# Patient Record
Sex: Male | Born: 1970 | Race: Black or African American | Hispanic: No | Marital: Single | Smoking: Never smoker
Health system: Southern US, Community
[De-identification: ages and names within clinical notes are randomized; demographics above are authoritative.]

## PROBLEM LIST (undated history)

## (undated) DIAGNOSIS — E119 Type 2 diabetes mellitus without complications: Secondary | ICD-10-CM

## (undated) DIAGNOSIS — E785 Hyperlipidemia, unspecified: Secondary | ICD-10-CM

## (undated) DIAGNOSIS — I10 Essential (primary) hypertension: Secondary | ICD-10-CM

## (undated) DIAGNOSIS — D573 Sickle-cell trait: Secondary | ICD-10-CM

## (undated) HISTORY — DX: Hyperlipidemia, unspecified: E78.5

## (undated) HISTORY — PX: KIDNEY STONE SURGERY: SHX686

## (undated) HISTORY — DX: Essential (primary) hypertension: I10

## (undated) HISTORY — DX: Type 2 diabetes mellitus without complications: E11.9

## (undated) HISTORY — PX: CYST EXCISION PERINEAL: SHX6278

## (undated) HISTORY — DX: Sickle-cell trait: D57.3

---

## 2020-01-27 ENCOUNTER — Emergency Department (HOSPITAL_COMMUNITY)
Admission: EM | Admit: 2020-01-27 | Discharge: 2020-01-27 | Disposition: A | Discharge status: Home / Self Care | Payer: HRSA Program | Attending: Emergency Medicine | Admitting: Emergency Medicine

## 2020-01-27 ENCOUNTER — Emergency Department (HOSPITAL_COMMUNITY): Payer: HRSA Program

## 2020-01-27 ENCOUNTER — Encounter (HOSPITAL_COMMUNITY): Payer: Self-pay | Admitting: Pediatrics

## 2020-01-27 ENCOUNTER — Other Ambulatory Visit: Payer: Self-pay

## 2020-01-27 DIAGNOSIS — U071 COVID-19: Secondary | ICD-10-CM | POA: Insufficient documentation

## 2020-01-27 DIAGNOSIS — R7989 Other specified abnormal findings of blood chemistry: Secondary | ICD-10-CM | POA: Diagnosis not present

## 2020-01-27 DIAGNOSIS — R05 Cough: Secondary | ICD-10-CM | POA: Diagnosis present

## 2020-01-27 LAB — COMPREHENSIVE METABOLIC PANEL
ALT: 26 U/L (ref 0–44)
AST: 34 U/L (ref 15–41)
Albumin: 3.8 g/dL (ref 3.5–5.0)
Alkaline Phosphatase: 69 U/L (ref 38–126)
Anion gap: 9 (ref 5–15)
BUN: 12 mg/dL (ref 6–20)
CO2: 29 mmol/L (ref 22–32)
Calcium: 9 mg/dL (ref 8.9–10.3)
Chloride: 97 mmol/L — ABNORMAL LOW (ref 98–111)
Creatinine, Ser: 1.75 mg/dL — ABNORMAL HIGH (ref 0.61–1.24)
GFR calc Af Amer: 51 mL/min — ABNORMAL LOW (ref >60)
GFR calc non Af Amer: 44 mL/min — ABNORMAL LOW (ref >60)
Glucose, Bld: 163 mg/dL — ABNORMAL HIGH (ref 70–99)
Potassium: 4.2 mmol/L (ref 3.5–5.1)
Sodium: 135 mmol/L (ref 135–145)
Total Bilirubin: 0.7 mg/dL (ref 0.3–1.2)
Total Protein: 7.6 g/dL (ref 6.5–8.1)

## 2020-01-27 LAB — CBC WITH DIFFERENTIAL/PLATELET
Abs Immature Granulocytes: 0.02 10*3/uL (ref 0.00–0.07)
Basophils Absolute: 0 10*3/uL (ref 0.0–0.1)
Basophils Relative: 0 %
Eosinophils Absolute: 0 10*3/uL (ref 0.0–0.5)
Eosinophils Relative: 0 %
HCT: 52.4 % — ABNORMAL HIGH (ref 39.0–52.0)
Hemoglobin: 16.7 g/dL (ref 13.0–17.0)
Immature Granulocytes: 0 %
Lymphocytes Relative: 17 %
Lymphs Abs: 0.9 10*3/uL (ref 0.7–4.0)
MCH: 21.1 pg — ABNORMAL LOW (ref 26.0–34.0)
MCHC: 31.9 g/dL (ref 30.0–36.0)
MCV: 66.3 fL — ABNORMAL LOW (ref 80.0–100.0)
Monocytes Absolute: 0.6 10*3/uL (ref 0.1–1.0)
Monocytes Relative: 12 %
Neutro Abs: 3.8 10*3/uL (ref 1.7–7.7)
Neutrophils Relative %: 71 %
Platelets: 194 10*3/uL (ref 150–400)
RBC: 7.9 MIL/uL — ABNORMAL HIGH (ref 4.22–5.81)
RDW: 19 % — ABNORMAL HIGH (ref 11.5–15.5)
WBC: 5.3 10*3/uL (ref 4.0–10.5)
nRBC: 0 % (ref 0.0–0.2)

## 2020-01-27 MED ORDER — IBUPROFEN 400 MG PO TABS
600.0000 mg | ORAL_TABLET | Freq: Once | ORAL | Status: AC
  Administered 2020-01-27: 600 mg via ORAL
  Filled 2020-01-27: qty 1

## 2020-01-27 MED ORDER — IOHEXOL 300 MG/ML  SOLN
100.0000 mL | Freq: Once | INTRAMUSCULAR | Status: AC | PRN
  Administered 2020-01-27: 100 mL via INTRAVENOUS

## 2020-01-27 MED ORDER — ALBUTEROL SULFATE HFA 108 (90 BASE) MCG/ACT IN AERS
2.0000 | INHALATION_SPRAY | Freq: Four times a day (QID) | RESPIRATORY_TRACT | 0 refills | Status: DC | PRN
Start: 2020-01-27 — End: 2020-02-04

## 2020-01-27 MED ORDER — ACETAMINOPHEN 500 MG PO TABS
1000.0000 mg | ORAL_TABLET | Freq: Once | ORAL | Status: AC
  Administered 2020-01-27: 14:00:00 1000 mg via ORAL
  Filled 2020-01-27: qty 2

## 2020-01-27 MED ORDER — BENZONATATE 100 MG PO CAPS
100.0000 mg | ORAL_CAPSULE | Freq: Three times a day (TID) | ORAL | 0 refills | Status: DC
Start: 2020-01-27 — End: 2020-02-04

## 2020-01-27 MED ORDER — SODIUM CHLORIDE 0.9 % IV BOLUS
1000.0000 mL | Freq: Once | INTRAVENOUS | Status: AC
  Administered 2020-01-27: 1000 mL via INTRAVENOUS

## 2020-01-27 NOTE — ED Notes (Signed)
Pt still on wait list for CTA due to scanner unavailability at this time. CT stated they would be able to scan the patient shortly

## 2020-01-27 NOTE — ED Provider Notes (Signed)
[facility] EMERGENCY DEPARTMENT Provider Note   CSN: [id number] Arrival date & time: 01/27/20  1124     History Chief Complaint  Patient presents with  . Chills    Jonathan Casey. is a 49 y.o. male.  HPI   Patient is a 49 year old male with no pertinent medical history who presents the emergency department today for evaluation of cough, shortness of breath, body aches, fevers, chills, decreased sense of taste/smell, that started a few days ago.  States he had an outpatient Covid test at [facility] that was positive.  He has had some decreased p.o. intake.  He has been taking Tylenol and Motrin for his symptoms.  He denies any other complaints.  History reviewed. No pertinent past medical history.  There are no problems to display for this patient.   History reviewed. No pertinent surgical history.     No family history on file.  Social History   Tobacco Use  . Smoking status: Not on file  Substance Use Topics  . Alcohol use: Not on file  . Drug use: Not on file    Home Medications Prior to Admission medications   Medication Sig Start Date End Date Taking? Authorizing Provider  albuterol (VENTOLIN HFA) 108 (90 Base) MCG/ACT inhaler Inhale 2 puffs into the lungs every 6 (six) hours as needed for wheezing or shortness of breath. 01/27/20   Carrington Mullenax S, PA-C  benzonatate (TESSALON) 100 MG capsule Take 1 capsule (100 mg total) by mouth every 8 (eight) hours for 5 days. 01/27/20 02/01/20  Ailah Barna S, PA-C    Allergies    Patient has no known allergies.  Review of Systems   Review of Systems  Constitutional: Positive for appetite change, chills and fever.  HENT: Negative for ear pain and sore throat.   Eyes: Negative for pain and visual disturbance.  Respiratory: Positive for cough and shortness of breath.   Cardiovascular: Negative for chest pain.  Gastrointestinal: Negative for abdominal pain, constipation, diarrhea, nausea and vomiting.    Genitourinary: Negative for dysuria and hematuria.  Musculoskeletal: Positive for myalgias.  Skin: Negative for color change and rash.  Neurological: Negative for headaches.  All other systems reviewed and are negative.   Physical Exam Updated Vital Signs BP (!) 158/99   Pulse (!) 102   Temp (!) 102.5 F (39.2 C) (Oral)   Resp 18   Ht 5\' 9"  (1.753 m)   Wt 119.3 kg   SpO2 95%   BMI 38.84 kg/m   Physical Exam Vitals and nursing note reviewed.  Constitutional:      Appearance: He is well-developed.  HENT:     Head: Normocephalic and atraumatic.  Eyes:     Conjunctiva/sclera: Conjunctivae normal.  Cardiovascular:     Rate and Rhythm: Regular rhythm. Tachycardia present.     Pulses: Normal pulses.     Heart sounds: Normal heart sounds. No murmur.  Pulmonary:     Effort: Pulmonary effort is normal. No respiratory distress.     Breath sounds: Normal breath sounds. No wheezing, rhonchi or rales.  Abdominal:     General: Bowel sounds are normal.     Palpations: Abdomen is soft.     Tenderness: There is no abdominal tenderness. There is no guarding or rebound.  Musculoskeletal:     Cervical back: Neck supple.  Skin:    General: Skin is warm and dry.  Neurological:     Mental Status: He is alert.  ED Results / Procedures / Treatments   Labs (all labs ordered are listed, but only abnormal results are displayed) Labs Reviewed  CBC WITH DIFFERENTIAL/PLATELET - Abnormal; Notable for the following components:      Result Value   RBC 7.90 (*)    HCT 52.4 (*)    MCV 66.3 (*)    MCH 21.1 (*)    RDW 19.0 (*)    All other components within normal limits  COMPREHENSIVE METABOLIC PANEL - Abnormal; Notable for the following components:   Chloride 97 (*)    Glucose, Bld 163 (*)    Creatinine, Ser 1.75 (*)    GFR calc non Af Amer 44 (*)    GFR calc Af Amer 51 (*)    All other components within normal limits    EKG None  Radiology DG Chest Portable 1 View  Result  Date: 01/27/2020 CLINICAL DATA:  Chills. Positive COVID test by report. Started feeling unwell 2 days ago. EXAM: PORTABLE CHEST 1 VIEW COMPARISON:  None. FINDINGS: Normal heart, mediastinum and hila. Clear lungs.  No pleural effusion or pneumothorax. Skeletal structures are grossly intact. IMPRESSION: No active disease. Electronically Signed   By: Lajean Manes M.D.   On: 01/27/2020 15:00    Procedures Procedures (including critical care time)  Medications Ordered in ED Medications  acetaminophen (TYLENOL) tablet 1,000 mg (1,000 mg Oral Given 01/27/20 1407)  ibuprofen (ADVIL) tablet 600 mg (600 mg Oral Given 01/27/20 1609)  sodium chloride 0.9 % bolus 1,000 mL (1,000 mLs Intravenous New Bag/Given 01/27/20 1721)    ED Course  I have reviewed the triage vital signs and the nursing notes.  Pertinent labs & imaging results that were available during my care of the patient were reviewed by me and considered in my medical decision making (see chart for details).    MDM Rules/Calculators/A&P                      49 year old male presenting for evaluation of URI symptoms.  He was diagnosed with Covid as an outpatient symptoms started a few days ago.    On arrival he is noted to be tachycardic and febrile.  The remainder of his vital signs are reassuring.  Reviewed labs, CBC is without leukocytosis.  BMP shows elevated creatinine, unclear if chronic or new.  His electrolytes did not have any gross abnormalities.  Patient was given Tylenol, ibuprofen and about 1 L of p.o. fluids.  On reassessment his heart rate mains elevated in the 120s.  Due to persistent tachycardia in the setting of known Covid infection and resolution of fever with fluid resuscitation, will obtain CTA of the chest to rule out PE as a cause of his tachycardia.  At shift change, pt pending CT chest. Care transitioned to Charmaine Downs, PA-C with plan to follow up on pending imaging. If neg, pt appropriate for d.c home. He will  need to follow up in 3-5 days for recheck of his creatinine.   ----  Jonathan Casey. was evaluated in Emergency Department on 01/27/2020 for the symptoms described in the history of present illness. He was evaluated in the context of the global COVID-19 pandemic, which necessitated consideration that the patient might be at risk for infection with the SARS-CoV-2 virus that causes COVID-19. Institutional protocols and algorithms that pertain to the evaluation of patients at risk for COVID-19 are in a state of rapid change based on information released by regulatory bodies including the CDC  and federal and state organizations. These policies and algorithms were followed during the patient's care in the ED.   Final Clinical Impression(s) / ED Diagnoses Final diagnoses:  COVID-19  Elevated serum creatinine    Rx / DC Orders ED Discharge Orders         Ordered    albuterol (VENTOLIN HFA) 108 (90 Base) MCG/ACT inhaler  Every 6 hours PRN     01/27/20 1856    benzonatate (TESSALON) 100 MG capsule  Every 8 hours     01/27/20 1856           Rodney Booze, PA-C 01/27/20 1856    Tegeler, Gwenyth Allegra, MD 01/30/20 1606

## 2020-01-27 NOTE — ED Notes (Signed)
Pt ambulated in room. O2 stats stayed above 95%.

## 2020-01-27 NOTE — ED Triage Notes (Signed)
C/O chills; stated he did a [facility] test [facility] test that was positive; statees he starrted feeling unwell x 2 days.

## 2020-01-27 NOTE — Discharge Instructions (Addendum)
You were given an prescipriton for an albuterol inhaler and cough medicaiton to help with your cough and shortness of breath.   You can continue to rotate Tylenol and Motrin to help treat your fevers and body aches.  Make sure you are staying well-hydrated home and eating even if you do not have an appetite.  Your kidney function was elevated today.  You will need to have your kidney function rechecked in the next 3 to 5 days.  You can follow-up here in the emergency department or urgent care to have this rechecked.  You were also given information to follow-up with the [facility] to establish primary care.  Please call the office to schedule an appointment for follow-up.    If you have any new or worsening symptoms in the meantime including shortness of breath, chest pain, or any other concerns and please return the emergency department immediately.

## 2020-01-27 NOTE — ED Provider Notes (Signed)
Care assumed from [facility], PA-C at shift change pending CTA to rule out PE. See her note for full HPI.  In short, patient is a 49 year old male who presents to the ED for an evaluation of cough, shortness of breath, body aches, fevers, chills, and loss of taste/smell that has been ongoing for the past few days.  Patient [facility].  Patient also admits to decreased p.o. intake.  Plan from previous provider: follow-up CTA chest to rule out PE.  Physical Exam  BP (!) 158/99   Pulse (!) 102   Temp (!) 102.5 F (39.2 C) (Oral)   Resp 18   Ht 5\' 9"  (1.753 m)   Wt 119.3 kg   SpO2 95%   BMI 38.84 kg/m   Physical Exam Vitals and nursing note reviewed.  Constitutional:      General: He is not in acute distress.    Appearance: He is not toxic-appearing.  HENT:     Head: Normocephalic.  Eyes:     Pupils: Pupils are equal, round, and reactive to light.  Cardiovascular:     Rate and Rhythm: Regular rhythm. Tachycardia present.     Pulses: Normal pulses.     Heart sounds: Normal heart sounds. No murmur. No friction rub. No gallop.   Pulmonary:     Effort: Pulmonary effort is normal.     Breath sounds: Normal breath sounds.  Abdominal:     General: Abdomen is flat. There is no distension.     Palpations: Abdomen is soft.     Tenderness: There is no abdominal tenderness. There is no guarding or rebound.  Musculoskeletal:     Cervical back: Neck supple.     Comments: No lower extremity edema.  Negative Homans' sign bilaterally.  Skin:    General: Skin is warm and dry.  Neurological:     General: No focal deficit present.     Mental Status: He is alert.     ED Course/Procedures     Procedures  MDM  Care assumed from [facility], PA-C at shift change pending CTA to rule out PE. See her note for full MDM.  49 year old male presents to the ED due to Covid symptoms.  Patient tested positive for Covid a few days prior.  Upon  arrival, tachycardic and febrile.  Physical exam reassuring.  Lungs clear to auscultation bilaterally.  No evidence of DVT on exam.  CBC reassuring with no leukocytosis.  CMP significant for mild elevation creatinine at 1.75 with normal BUN.  Chest x-ray personally reviewed which is negative for signs of pneumonia, pneumothorax, or widened mediastinum.  During patient's ED stay, he was given 1 L IV fluids and Tylenol and Advil for his fever.  Patient's heart rate has decreased throughout his stay.  CTA personally reviewed which demonstrates: IMPRESSION:  1. Numerous small multifocal infiltrates scattered throughout both  lungs.  2. Very small bilateral pleural effusions.   Suspect tachycardia related to patient's fever. Patient only given 1L IVFs due to known positive COVID status. Patient able to ambulate in the ED and maintain O2 saturation >95% without any difficulties. Will discharge patient with albuterol and cough medication. Patient instructed to take over the counter Tylenol and ibuprofen as needed for fever and body aches. Quarantine guidelines discussed with patient. Advised patient to follow-up with PCP if symptoms do not improve within the next week. Strict ED precautions discussed with patient. Patient states understanding and agrees to plan.  Patient discharged home in no acute distress and stable vitals      Karie Kirks 01/27/20 2150    Maudie Flakes, MD 01/30/20 [phone]

## 2020-01-27 NOTE — ED Notes (Signed)
Patient verbalizes understanding of discharge instructions. Opportunity for questioning and answers were provided. Armband removed by staff, pt discharged from ED and ambulated to vehicle to return home.

## 2020-01-29 ENCOUNTER — Other Ambulatory Visit: Payer: Self-pay | Admitting: Unknown Physician Specialty

## 2020-01-29 ENCOUNTER — Telehealth: Payer: Self-pay | Admitting: Unknown Physician Specialty

## 2020-01-29 ENCOUNTER — Encounter (HOSPITAL_COMMUNITY): Payer: Self-pay | Admitting: Emergency Medicine

## 2020-01-29 ENCOUNTER — Emergency Department (HOSPITAL_COMMUNITY)
Admission: EM | Admit: 2020-01-29 | Discharge: 2020-01-29 | Disposition: A | Discharge status: Home / Self Care | Payer: HRSA Program | Attending: Emergency Medicine | Admitting: Emergency Medicine

## 2020-01-29 DIAGNOSIS — R05 Cough: Secondary | ICD-10-CM | POA: Insufficient documentation

## 2020-01-29 DIAGNOSIS — U071 COVID-19: Secondary | ICD-10-CM | POA: Diagnosis not present

## 2020-01-29 DIAGNOSIS — R0602 Shortness of breath: Secondary | ICD-10-CM | POA: Diagnosis not present

## 2020-01-29 DIAGNOSIS — M7918 Myalgia, other site: Secondary | ICD-10-CM | POA: Diagnosis present

## 2020-01-29 DIAGNOSIS — Z79899 Other long term (current) drug therapy: Secondary | ICD-10-CM | POA: Insufficient documentation

## 2020-01-29 MED ORDER — SODIUM CHLORIDE 0.9 % IV SOLN
Freq: Once | INTRAVENOUS | Status: AC
  Filled 2020-01-29: qty 700

## 2020-01-29 MED ORDER — ACETAMINOPHEN 500 MG PO TABS
1000.0000 mg | ORAL_TABLET | Freq: Once | ORAL | Status: AC
  Administered 2020-01-29: 02:00:00 1000 mg via ORAL
  Filled 2020-01-29: qty 2

## 2020-01-29 NOTE — ED Notes (Signed)
Pt verbalized understanding of d/c instructions, follow up for infusion therapy, hydration and other symptom management. Pt ambulatory to [place] with steady gait.

## 2020-01-29 NOTE — ED Triage Notes (Signed)
Pt concerned about worsening bodyaches,cramping,+COVID last week.

## 2020-01-29 NOTE — Telephone Encounter (Signed)
Called to discuss with patient about Covid symptoms and the use of bamlanivimab, a monoclonal antibody infusion for those with mild to moderate Covid symptoms and at a high risk of hospitalization.  Pt is qualified for this infusion at the [facility] infusion center due to [employer] left to call back

## 2020-01-29 NOTE — ED Provider Notes (Signed)
[facility] EMERGENCY DEPARTMENT Provider Note  CSN: [id number] Arrival date & time: 01/29/20 0021  Chief Complaint(s) Generalized Body Aches  HPI Jonathan Casey. is a 49 y.o. male who presents to the emergency department for myalgias related to known COVID-19 infection.  Patient tested positive on April 21.  Reports that he has been getting generalized body cramps.  He is endorsing continued cough.  No nausea or vomiting.  No chest pain.  Mild shortness of breath.  No abdominal pain.  No diarrhea.  Patient is still able to hydrate.  No other physical complaints.  HPI  Past Medical History History reviewed. No pertinent past medical history. There are no problems to display for this patient.  Home Medication(s) Prior to Admission medications   Medication Sig Start Date End Date Taking? Authorizing Provider  albuterol (VENTOLIN HFA) 108 (90 Base) MCG/ACT inhaler Inhale 2 puffs into the lungs every 6 (six) hours as needed for wheezing or shortness of breath. 01/27/20   Couture, Cortni S, PA-C  Ascorbic Acid (VITAMIN C PO) Take 500 mcg by mouth daily.    [provider]  benzonatate (TESSALON) 100 MG capsule Take 1 capsule (100 mg total) by mouth every 8 (eight) hours for 5 days. 01/27/20 02/01/20  Couture, Cortni S, PA-C  cholecalciferol (VITAMIN D3) 25 MCG (1000 UNIT) tablet Take 1,000 Units by mouth daily.    [provider]  Misc Natural Products (AIRBORNE ELDERBERRY) CHEW Chew 2 tablets by mouth daily.    [provider]                                                                                                                                    Past Surgical History History reviewed. No pertinent surgical history. Family History No family history on file.  Social History Social History   Tobacco Use  . Smoking status: Not on file  Substance Use Topics  . Alcohol use: Not on file  . Drug use: Not on file   Allergies Patient  has no known allergies.  Review of Systems Review of Systems All other systems are reviewed and are negative for acute change except as noted in the HPI  Physical Exam Vital Signs  I have reviewed the triage vital signs BP (!) 142/99 (BP Location: Left Arm)   Pulse (!) 112   Temp 99.4 F (37.4 C) (Oral)   Resp 14   Ht 5\' 9"  (1.753 m)   Wt 119.3 kg   SpO2 96%   BMI 38.84 kg/m   Physical Exam Vitals reviewed.  Constitutional:      General: He is not in acute distress.    Appearance: He is well-developed. He is not diaphoretic.  HENT:     Head: Normocephalic and atraumatic.     Nose: Nose normal.  Eyes:     General: No scleral icterus.       Right eye:  No discharge.        Left eye: No discharge.     Conjunctiva/sclera: Conjunctivae normal.     Pupils: Pupils are equal, round, and reactive to light.  Cardiovascular:     Rate and Rhythm: Regular rhythm. Tachycardia present.     Heart sounds: No murmur. No friction rub. No gallop.   Pulmonary:     Effort: Pulmonary effort is normal. No respiratory distress.     Breath sounds: Normal breath sounds. No stridor. No rales.  Abdominal:     General: There is no distension.     Palpations: Abdomen is soft.     Tenderness: There is no abdominal tenderness.  Musculoskeletal:        General: No tenderness.     Cervical back: Normal range of motion and neck supple.  Skin:    General: Skin is warm and dry.     Findings: No erythema or rash.  Neurological:     Mental Status: He is alert and oriented to person, place, and time.     ED Results and Treatments Labs (all labs ordered are listed, but only abnormal results are displayed) Labs Reviewed - No data to display                                                                                                                       EKG  EKG Interpretation  Date/Time:    Ventricular Rate:    PR Interval:    QRS Duration:   QT Interval:    QTC Calculation:   R Axis:       Text Interpretation:        Radiology No results found.  Pertinent labs & imaging results that were available during my care of the patient were reviewed by me and considered in my medical decision making (see chart for details).  Medications Ordered in ED Medications  acetaminophen (TYLENOL) tablet 1,000 mg (has no administration in time range)                                                                                                                                    Procedures Procedures  (including critical care time)  Medical Decision Making / ED Course I have reviewed the nursing notes for this encounter and the patient's prior records (if available in EHR or on provided paperwork).   Jonathan Casey. was evaluated in  Emergency Department on 01/29/2020 for the symptoms described in the history of present illness. He was evaluated in the context of the global COVID-19 pandemic, which necessitated consideration that the patient might be at risk for infection with the SARS-CoV-2 virus that causes COVID-19. Institutional protocols and algorithms that pertain to the evaluation of patients at risk for COVID-19 are in a state of rapid change based on information released by regulatory bodies including the CDC and federal and state organizations. These policies and algorithms were followed during the patient's care in the ED.  Patient with known COVID-19 infection.  Here for persistent myalgias.  He is well-appearing, well-hydrated.  He is tachycardic.  No evidence of dehydration.  Sats greater than 95% on room air.  No work-up necessary at this time.  Patient is appropriate for continued outpatient management.  Referred to [facility] infusion center for monoclonal antibody.  I left a message with the hotline, and provided the patient with directions to [facility].      Final Clinical Impression(s) / ED Diagnoses Final diagnoses:  [id number] virus infection    The patient  appears reasonably screened and/or stabilized for discharge and I doubt any other medical condition or other [facility] requiring further screening, evaluation, or treatment in the ED at this time prior to discharge. Safe for discharge with strict return precautions.  Disposition: Discharge  Condition: Good  I have discussed the results, Dx and Tx plan with the patient/family who expressed understanding and agree(s) with the plan. Discharge instructions discussed at length. The patient/family was given strict return precautions who verbalized understanding of the instructions. No further questions at time of discharge.    ED Discharge Orders    None        Follow Up: [facility] [phone] Go in 1 day for monoclonal infusion to help treat COVID infection (call if [phone] if needed)     This chart was dictated using voice recognition software.  Despite best efforts to proofread,  errors can occur which can change the documentation meaning.   Fatima Blank, MD 01/29/20 [phone]

## 2020-01-29 NOTE — Telephone Encounter (Signed)
  I connected by phone with [name]. on 01/29/2020 at 9:27 AM to discuss the potential use of an new treatment for mild to moderate COVID-19 viral infection in non-hospitalized patients.  This patient is a 49 y.o. male that meets the FDA criteria for Emergency Use Authorization of bamlanivimab/etesevimab or casirivimab/imdevimab.  Has a (+) direct SARS-CoV-2 viral test result  Has mild or moderate COVID-19   Is ? 51 years of age and weighs ? 40 kg  Is NOT hospitalized due to COVID-19  Is NOT requiring oxygen therapy or requiring an increase in baseline oxygen flow rate due to COVID-19  Is within 10 days of symptom onset  Has at least one of the high risk factor(s) for progression to severe COVID-19 and/or hospitalization as defined in EUA.  Specific high risk criteria : BMI >/= 35   I have spoken and communicated the following to the patient or parent/caregiver:  1. FDA has authorized the emergency use of bamlanivimab/etesevimab and casirivimab\imdevimab for the treatment of mild to moderate COVID-19 in adults and pediatric patients with positive results of direct SARS-CoV-2 viral testing who are 19 years of age and older weighing at least 40 kg, and who are at high risk for progressing to severe COVID-19 and/or hospitalization.  2. The significant known and potential risks and benefits of bamlanivimab/etesevimab and casirivimab\imdevimab, and the extent to which such potential risks and benefits are unknown.  3. Information on available alternative treatments and the risks and benefits of those alternatives, including clinical trials.  4. Patients treated with bamlanivimab/etesevimab and casirivimab\imdevimab should continue to self-isolate and use infection control measures (e.g., wear mask, isolate, social distance, avoid sharing personal items, clean and disinfect "high touch" surfaces, and frequent handwashing) according to CDC guidelines.   5. The patient or  parent/caregiver has the option to accept or refuse bamlanivimab/etesevimab or casirivimab\imdevimab .  After reviewing this information with the patient, The patient agreed to proceed with receiving the bamlanimivab infusion and will be provided a copy of the Fact sheet prior to receiving the infusion.Kathrine Haddock 01/29/2020 9:27 AM

## 2020-01-30 ENCOUNTER — Inpatient Hospital Stay (HOSPITAL_COMMUNITY)
Admission: EM | Admit: 2020-01-30 | Discharge: 2020-02-04 | DRG: 177 | Disposition: A | Discharge status: Home / Self Care | Payer: HRSA Program | Attending: Internal Medicine | Admitting: Internal Medicine

## 2020-01-30 ENCOUNTER — Encounter (HOSPITAL_COMMUNITY): Payer: Self-pay

## 2020-01-30 ENCOUNTER — Emergency Department (HOSPITAL_COMMUNITY): Payer: HRSA Program

## 2020-01-30 ENCOUNTER — Ambulatory Visit (HOSPITAL_COMMUNITY)
Admission: RE | Admit: 2020-01-30 | Discharge: 2020-01-30 | Disposition: A | Discharge status: Home / Self Care | Payer: HRSA Program | Source: Ambulatory Visit | Attending: Pulmonary Disease | Admitting: Pulmonary Disease

## 2020-01-30 ENCOUNTER — Other Ambulatory Visit: Payer: Self-pay | Admitting: Physician Assistant

## 2020-01-30 ENCOUNTER — Other Ambulatory Visit: Payer: Self-pay

## 2020-01-30 DIAGNOSIS — E860 Dehydration: Secondary | ICD-10-CM | POA: Diagnosis present

## 2020-01-30 DIAGNOSIS — U071 COVID-19: Secondary | ICD-10-CM | POA: Diagnosis present

## 2020-01-30 DIAGNOSIS — Z6838 Body mass index (BMI) 38.0-38.9, adult: Secondary | ICD-10-CM | POA: Diagnosis not present

## 2020-01-30 DIAGNOSIS — I160 Hypertensive urgency: Secondary | ICD-10-CM | POA: Diagnosis not present

## 2020-01-30 DIAGNOSIS — E871 Hypo-osmolality and hyponatremia: Secondary | ICD-10-CM | POA: Diagnosis not present

## 2020-01-30 DIAGNOSIS — E669 Obesity, unspecified: Secondary | ICD-10-CM | POA: Diagnosis not present

## 2020-01-30 DIAGNOSIS — R0789 Other chest pain: Secondary | ICD-10-CM | POA: Diagnosis not present

## 2020-01-30 DIAGNOSIS — N179 Acute kidney failure, unspecified: Secondary | ICD-10-CM | POA: Diagnosis not present

## 2020-01-30 DIAGNOSIS — J9601 Acute respiratory failure with hypoxia: Secondary | ICD-10-CM | POA: Diagnosis not present

## 2020-01-30 DIAGNOSIS — J1282 Pneumonia due to coronavirus disease 2019: Secondary | ICD-10-CM | POA: Diagnosis present

## 2020-01-30 DIAGNOSIS — I10 Essential (primary) hypertension: Secondary | ICD-10-CM | POA: Diagnosis present

## 2020-01-30 DIAGNOSIS — Z79899 Other long term (current) drug therapy: Secondary | ICD-10-CM

## 2020-01-30 LAB — COMPREHENSIVE METABOLIC PANEL
ALT: 53 U/L — ABNORMAL HIGH (ref 0–44)
AST: 133 U/L — ABNORMAL HIGH (ref 15–41)
Albumin: 3.6 g/dL (ref 3.5–5.0)
Alkaline Phosphatase: 65 U/L (ref 38–126)
Anion gap: 13 (ref 5–15)
BUN: 10 mg/dL (ref 6–20)
CO2: 22 mmol/L (ref 22–32)
Calcium: 8.2 mg/dL — ABNORMAL LOW (ref 8.9–10.3)
Chloride: 95 mmol/L — ABNORMAL LOW (ref 98–111)
Creatinine, Ser: 1.42 mg/dL — ABNORMAL HIGH (ref 0.61–1.24)
GFR calc Af Amer: >60 mL/min (ref >60)
GFR calc non Af Amer: 57 mL/min — ABNORMAL LOW (ref >60)
Glucose, Bld: 116 mg/dL — ABNORMAL HIGH (ref 70–99)
Potassium: 4.4 mmol/L (ref 3.5–5.1)
Sodium: 130 mmol/L — ABNORMAL LOW (ref 135–145)
Total Bilirubin: 1.1 mg/dL (ref 0.3–1.2)
Total Protein: 7.2 g/dL (ref 6.5–8.1)

## 2020-01-30 LAB — CBC WITH DIFFERENTIAL/PLATELET
Abs Immature Granulocytes: 0.02 10*3/uL (ref 0.00–0.07)
Basophils Absolute: 0 10*3/uL (ref 0.0–0.1)
Basophils Relative: 0 %
Eosinophils Absolute: 0.1 10*3/uL (ref 0.0–0.5)
Eosinophils Relative: 2 %
HCT: 51.7 % (ref 39.0–52.0)
Hemoglobin: 17 g/dL (ref 13.0–17.0)
Immature Granulocytes: 0 %
Lymphocytes Relative: 15 %
Lymphs Abs: 0.8 10*3/uL (ref 0.7–4.0)
MCH: 21.9 pg — ABNORMAL LOW (ref 26.0–34.0)
MCHC: 32.9 g/dL (ref 30.0–36.0)
MCV: 66.5 fL — ABNORMAL LOW (ref 80.0–100.0)
Monocytes Absolute: 0.4 10*3/uL (ref 0.1–1.0)
Monocytes Relative: 7 %
Neutro Abs: 4.3 10*3/uL (ref 1.7–7.7)
Neutrophils Relative %: 76 %
Platelets: 202 10*3/uL (ref 150–400)
RBC: 7.77 MIL/uL — ABNORMAL HIGH (ref 4.22–5.81)
RDW: 18.6 % — ABNORMAL HIGH (ref 11.5–15.5)
WBC: 5.7 10*3/uL (ref 4.0–10.5)
nRBC: 0 % (ref 0.0–0.2)

## 2020-01-30 LAB — LACTIC ACID, PLASMA
Lactic Acid, Venous: 1.9 mmol/L (ref 0.5–1.9)
Lactic Acid, Venous: 1.9 mmol/L (ref 0.5–1.9)

## 2020-01-30 LAB — URINALYSIS, ROUTINE W REFLEX MICROSCOPIC
Bilirubin Urine: NEGATIVE
Glucose, UA: NEGATIVE mg/dL
Ketones, ur: 20 mg/dL — AB
Leukocytes,Ua: NEGATIVE
Nitrite: NEGATIVE
Protein, ur: 100 mg/dL — AB
Specific Gravity, Urine: 1.014 (ref 1.005–1.030)
pH: 5 (ref 5.0–8.0)

## 2020-01-30 LAB — ABO/RH: ABO/RH(D): A POS

## 2020-01-30 LAB — PROCALCITONIN: Procalcitonin: 0.39 ng/mL

## 2020-01-30 LAB — D-DIMER, QUANTITATIVE: D-Dimer, Quant: 1.37 ug/mL-FEU — ABNORMAL HIGH (ref 0.00–0.50)

## 2020-01-30 LAB — C-REACTIVE PROTEIN: CRP: 17.6 mg/dL — ABNORMAL HIGH (ref <1.0)

## 2020-01-30 LAB — SODIUM, URINE, RANDOM: Sodium, Ur: 49 mmol/L

## 2020-01-30 LAB — TROPONIN I (HIGH SENSITIVITY)
Troponin I (High Sensitivity): 10 ng/L (ref <18)
Troponin I (High Sensitivity): 10 ng/L (ref <18)

## 2020-01-30 LAB — FIBRINOGEN: Fibrinogen: 574 mg/dL — ABNORMAL HIGH (ref 210–475)

## 2020-01-30 LAB — LACTATE DEHYDROGENASE: LDH: 507 U/L — ABNORMAL HIGH (ref 98–192)

## 2020-01-30 LAB — TRIGLYCERIDES: Triglycerides: 69 mg/dL (ref <150)

## 2020-01-30 LAB — HIV ANTIBODY (ROUTINE TESTING W REFLEX): HIV Screen 4th Generation wRfx: NONREACTIVE

## 2020-01-30 LAB — FERRITIN: Ferritin: 1082 ng/mL — ABNORMAL HIGH (ref 24–336)

## 2020-01-30 LAB — CREATININE, URINE, RANDOM: Creatinine, Urine: 133.05 mg/dL

## 2020-01-30 MED ORDER — SODIUM CHLORIDE 0.9 % IV SOLN: INTRAVENOUS | Status: DC | PRN

## 2020-01-30 MED ORDER — FAMOTIDINE IN NACL 20-0.9 MG/50ML-% IV SOLN: 20.0000 mg | Freq: Once | INTRAVENOUS | Status: DC | PRN

## 2020-01-30 MED ORDER — EPINEPHRINE 0.3 MG/0.3ML IJ SOAJ: 0.3000 mg | Freq: Once | INTRAMUSCULAR | Status: DC | PRN

## 2020-01-30 MED ORDER — METHYLPREDNISOLONE SODIUM SUCC 125 MG IJ SOLR: 125.0000 mg | Freq: Once | INTRAMUSCULAR | Status: DC | PRN

## 2020-01-30 MED ORDER — ZINC SULFATE 220 (50 ZN) MG PO CAPS
220.0000 mg | ORAL_CAPSULE | Freq: Every day | ORAL | Status: DC
  Administered 2020-01-30 – 2020-02-04 (×6): 220 mg via ORAL
  Filled 2020-01-30 (×6): qty 1

## 2020-01-30 MED ORDER — GUAIFENESIN-DM 100-10 MG/5ML PO SYRP: 10.0000 mL | ORAL_SOLUTION | ORAL | Status: DC | PRN

## 2020-01-30 MED ORDER — SODIUM CHLORIDE 0.9 % IV SOLN
2.0000 g | INTRAVENOUS | Status: AC
  Administered 2020-01-30 – 2020-02-03 (×5): 2 g via INTRAVENOUS
  Filled 2020-01-30 (×6): qty 20

## 2020-01-30 MED ORDER — ONDANSETRON HCL 4 MG/2ML IJ SOLN: 4.0000 mg | Freq: Four times a day (QID) | INTRAMUSCULAR | Status: DC | PRN

## 2020-01-30 MED ORDER — AZITHROMYCIN 500 MG PO TABS
500.0000 mg | ORAL_TABLET | Freq: Every day | ORAL | Status: DC
  Administered 2020-01-30 – 2020-02-01 (×3): 500 mg via ORAL
  Filled 2020-01-30: qty 2
  Filled 2020-01-30 (×2): qty 1

## 2020-01-30 MED ORDER — HYDROCOD POLST-CPM POLST ER 10-8 MG/5ML PO SUER: 5.0000 mL | Freq: Two times a day (BID) | ORAL | Status: DC | PRN

## 2020-01-30 MED ORDER — ACETAMINOPHEN 325 MG PO TABS
650.0000 mg | ORAL_TABLET | Freq: Once | ORAL | Status: AC
  Administered 2020-01-30: 650 mg via ORAL
  Filled 2020-01-30: qty 2

## 2020-01-30 MED ORDER — SODIUM CHLORIDE 0.9 % IV SOLN
100.0000 mg | Freq: Every day | INTRAVENOUS | Status: AC
  Administered 2020-01-31 – 2020-02-03 (×4): 100 mg via INTRAVENOUS
  Filled 2020-01-30 (×5): qty 20

## 2020-01-30 MED ORDER — LABETALOL HCL 5 MG/ML IV SOLN
10.0000 mg | INTRAVENOUS | Status: DC | PRN
  Administered 2020-01-30: 10 mg via INTRAVENOUS
  Filled 2020-01-30: qty 4

## 2020-01-30 MED ORDER — ALBUTEROL SULFATE HFA 108 (90 BASE) MCG/ACT IN AERS: 2.0000 | INHALATION_SPRAY | Freq: Once | RESPIRATORY_TRACT | Status: DC | PRN

## 2020-01-30 MED ORDER — CALCIUM GLUCONATE-NACL 1-0.675 GM/50ML-% IV SOLN
1.0000 g | Freq: Once | INTRAVENOUS | Status: AC
  Administered 2020-01-30: 1000 mg via INTRAVENOUS
  Filled 2020-01-30: qty 50

## 2020-01-30 MED ORDER — ENOXAPARIN SODIUM 40 MG/0.4ML ~~LOC~~ SOLN
40.0000 mg | SUBCUTANEOUS | Status: DC
  Administered 2020-01-30 – 2020-02-03 (×5): 40 mg via SUBCUTANEOUS
  Filled 2020-01-30 (×5): qty 0.4

## 2020-01-30 MED ORDER — SODIUM CHLORIDE 0.9 % IV SOLN
Freq: Once | INTRAVENOUS | Status: AC
  Administered 2020-01-31: 1000 mL via INTRAVENOUS

## 2020-01-30 MED ORDER — DIPHENHYDRAMINE HCL 50 MG/ML IJ SOLN: 50.0000 mg | Freq: Once | INTRAMUSCULAR | Status: DC | PRN

## 2020-01-30 MED ORDER — ONDANSETRON HCL 4 MG PO TABS: 4.0000 mg | ORAL_TABLET | Freq: Four times a day (QID) | ORAL | Status: DC | PRN

## 2020-01-30 MED ORDER — FAMOTIDINE 20 MG PO TABS
20.0000 mg | ORAL_TABLET | Freq: Two times a day (BID) | ORAL | Status: DC
  Administered 2020-01-30 – 2020-02-04 (×10): 20 mg via ORAL
  Filled 2020-01-30 (×10): qty 1

## 2020-01-30 MED ORDER — DEXAMETHASONE 6 MG PO TABS
6.0000 mg | ORAL_TABLET | ORAL | Status: DC
  Administered 2020-01-30 – 2020-01-31 (×2): 6 mg via ORAL
  Filled 2020-01-30: qty 2
  Filled 2020-01-30: qty 1

## 2020-01-30 MED ORDER — SODIUM CHLORIDE 0.9 % IV BOLUS
1000.0000 mL | Freq: Once | INTRAVENOUS | Status: AC
  Administered 2020-01-30: 1000 mL via INTRAVENOUS

## 2020-01-30 MED ORDER — ASCORBIC ACID 500 MG PO TABS
500.0000 mg | ORAL_TABLET | Freq: Every day | ORAL | Status: DC
  Administered 2020-01-30 – 2020-02-04 (×6): 500 mg via ORAL
  Filled 2020-01-30 (×6): qty 1

## 2020-01-30 MED ORDER — SODIUM CHLORIDE 0.9 % IV BOLUS: 500.0000 mL | Freq: Once | INTRAVENOUS | Status: AC

## 2020-01-30 MED ORDER — ACETAMINOPHEN 325 MG PO TABS
650.0000 mg | ORAL_TABLET | Freq: Four times a day (QID) | ORAL | Status: DC | PRN
  Administered 2020-01-30: 650 mg via ORAL
  Filled 2020-01-30: qty 2

## 2020-01-30 MED ORDER — SODIUM CHLORIDE 0.9% FLUSH
3.0000 mL | Freq: Two times a day (BID) | INTRAVENOUS | Status: DC
  Administered 2020-01-31 – 2020-02-04 (×9): 3 mL via INTRAVENOUS

## 2020-01-30 MED ORDER — ALBUTEROL SULFATE HFA 108 (90 BASE) MCG/ACT IN AERS
2.0000 | INHALATION_SPRAY | Freq: Four times a day (QID) | RESPIRATORY_TRACT | Status: DC
  Administered 2020-01-30 – 2020-02-04 (×18): 2 via RESPIRATORY_TRACT
  Filled 2020-01-30 (×3): qty 6.7

## 2020-01-30 MED ORDER — SODIUM CHLORIDE 0.9 % IV SOLN
200.0000 mg | Freq: Once | INTRAVENOUS | Status: AC
  Administered 2020-01-30: 200 mg via INTRAVENOUS
  Filled 2020-01-30: qty 40

## 2020-01-30 NOTE — H&P (Addendum)
History and Physical    Jonathan Casey. [id number] DOB: 1971/06/05 DOA: 01/30/2020  Referring MD/NP/PA: Quintella Reichert PCP: Patient, No Pcp Per  Patient coming from: Infusion center via EMS  Chief Complaint: Shortness of breath  I have personally briefly reviewed patient's old medical records in [place]   HPI: Jonathan Casey. is a 49 y.o. male without significant past medical history who presented to the hospital with complaints of progressive shortness of breath over the last 10 days.  Notes associated symptoms of mostly dry cough, fever, chills, chest discomfort with coughing/breathing, loss of taste, decreased appetite, a little bit of diarrhea today, and generalized malaise.  He was formally diagnosed with COVID-19 on 4/20.  He had been seen in the ED on 4/23 and again on 4/25 for his symptoms.  Review of records notes he been evaluated with a CT angiogram of the chest due to elevated D-dimer which revealed bilateral infiltrates with small bilateral pleural effusions on 4/23.  At home he had been utilizing Tylenol, Tessalon Perles, and albuterol inhaler given to him without significant relief of symptoms.  Denies having any vomiting.  Patient does not smoke or drink alcohol on a daily basis.  He had gone to the [facility] infusion center today and had received the infusion.  However, patient was noted to be tachycardic and hypoxic with O2 saturations noted as low as 86% on room air for which he was referred to the emergency department.  ED Course: Upon admission into the emergency department patient was seen to be febrile up to 101 F, pulse 1 23-1 38, respirations 22-32, blood pressure elevated up to 195/105, and O2 saturations as low as 86% with improved on 2 L nasal cannula oxygen to 96%.  Labs significant for sodium 130, BUN 10, and creatinine 1.42(down from 1.75 on 4/23), calcium 8.2, AST 133, ALT 53, LDH 507, lactic acid 1.9, procalcitonin 0.39, D-dimer 1.37, and fibrinogen 574.   Patient had just recently had a CT angiogram of the chest on 4/23.  Review of Systems  Constitutional: Positive for chills, diaphoresis, fever and malaise/fatigue.  HENT: Negative for congestion and sinus pain.   Eyes: Negative for photophobia and pain.  Respiratory: Positive for cough and shortness of breath. Negative for wheezing.   Cardiovascular: Positive for chest pain. Negative for leg swelling.  Gastrointestinal: Positive for diarrhea. Negative for abdominal pain, nausea and vomiting.  Genitourinary: Negative for dysuria and hematuria.  Musculoskeletal: Negative for falls and joint pain.  Skin: Negative for rash.  Neurological: Negative for loss of consciousness and headaches.  Endo/Heme/Allergies: Negative for polydipsia. Does not bruise/bleed easily.  Psychiatric/Behavioral: Negative for substance abuse.    History reviewed. No pertinent past medical history.  History reviewed. No pertinent surgical history.   reports that he has never smoked. He has never used smokeless tobacco. He reports that he does not drink alcohol or use drugs.  No Known Allergies  History reviewed. No pertinent family history.  Prior to Admission medications   Medication Sig Start Date End Date Taking? Authorizing Provider  albuterol (VENTOLIN HFA) 108 (90 Base) MCG/ACT inhaler Inhale 2 puffs into the lungs every 6 (six) hours as needed for wheezing or shortness of breath. 01/27/20  Yes Couture, Cortni S, PA-C  Ascorbic Acid (VITAMIN C PO) Take 500 mcg by mouth daily.   Yes [provider]  benzonatate (TESSALON) 100 MG capsule Take 1 capsule (100 mg total) by mouth every 8 (eight) hours for 5 days. 01/27/20 02/01/20  Yes Couture, Cortni S, PA-C  cholecalciferol (VITAMIN D3) 25 MCG (1000 UNIT) tablet Take 1,000 Units by mouth daily.   Yes [provider]  Misc Natural Products (AIRBORNE ELDERBERRY) CHEW Chew 2 tablets by mouth daily.   Yes [provider]    Physical  Exam:  Constitutional: Overweight male who appears to be acutely ill Vitals:   01/30/20 1428 01/30/20 1430 01/30/20 1451 01/30/20 1510  BP:   (!) 195/105   Pulse: (!) 128 (!) 127 (!) 125   Resp: (!) 25 (!) 27 (!) 22   Temp:    98.9 F (37.2 C)  TempSrc:    Oral  SpO2: 94% 94% 96%   Weight:      Height:       Eyes: PERRL, lids and conjunctivae normal ENMT: Mucous membranes are dry. Posterior pharynx clear of any exudate or lesions.  Neck: normal, supple, no masses, no thyromegaly Respiratory: Tachypneic with decreased overall aeration patient on 2.5L of oxygen at this time able to talk in relatively complete sentences. Cardiovascular: Regular rate and rhythm, no murmurs / rubs / gallops. No extremity edema. 2+ pedal pulses. No carotid bruits.  Abdomen: no tenderness, no masses palpated. No hepatosplenomegaly. Bowel sounds positive.  Musculoskeletal: no clubbing / cyanosis. No joint deformity upper and lower extremities. Good ROM, no contractures. Normal muscle tone.  Skin: Diaphoretic.  No rashes, lesions, ulcers. No induration Neurologic: CN 2-12 grossly intact. Sensation intact, DTR normal. Strength 5/5 in all 4.  Psychiatric: Normal judgment and insight. Alert and oriented x 3. Normal mood.     Labs on Admission: I have personally reviewed following labs and imaging studies  CBC: Recent Labs  Lab 01/27/20 1207 01/30/20 1317  WBC 5.3 5.7  NEUTROABS 3.8 4.3  HGB 16.7 17.0  HCT 52.4* 51.7  MCV 66.3* 66.5*  PLT 194 [id number]   Basic Metabolic Panel: Recent Labs  Lab 01/27/20 1207 01/30/20 1317  NA 135 130*  K 4.2 4.4  CL 97* 95*  CO2 29 22  GLUCOSE 163* 116*  BUN 12 10  CREATININE 1.75* 1.42*  CALCIUM 9.0 8.2*   GFR: Estimated Creatinine Clearance: 78.4 mL/min (A) (by C-G formula based on SCr of 1.42 mg/dL (H)). Liver Function Tests: Recent Labs  Lab 01/27/20 1207 01/30/20 1317  AST 34 133*  ALT 26 53*  ALKPHOS 69 65  BILITOT 0.7 1.1  PROT 7.6 7.2  ALBUMIN  3.8 3.6   No results for input(s): LIPASE, AMYLASE in the last 168 hours. No results for input(s): AMMONIA in the last 168 hours. Coagulation Profile: No results for input(s): INR, PROTIME in the last 168 hours. Cardiac Enzymes: No results for input(s): CKTOTAL, CKMB, CKMBINDEX, TROPONINI in the last 168 hours. BNP (last 3 results) No results for input(s): PROBNP in the last 8760 hours. HbA1C: No results for input(s): HGBA1C in the last 72 hours. CBG: No results for input(s): GLUCAP in the last 168 hours. Lipid Profile: Recent Labs    01/30/20 1317  TRIG 69   Thyroid Function Tests: No results for input(s): TSH, T4TOTAL, FREET4, T3FREE, THYROIDAB in the last 72 hours. Anemia Panel: No results for input(s): VITAMINB12, FOLATE, FERRITIN, TIBC, IRON, RETICCTPCT in the last 72 hours. Urine analysis: No results found for: COLORURINE, APPEARANCEUR, LABSPEC, PHURINE, GLUCOSEU, HGBUR, BILIRUBINUR, KETONESUR, PROTEINUR, UROBILINOGEN, NITRITE, LEUKOCYTESUR Sepsis Labs: No results found for this or any previous visit (from the past 240 hour(s)).   Radiological Exams on Admission: DG Chest [facility]  Result Date: 01/30/2020 CLINICAL DATA:  Shortness of breath, chest pain. EXAM: PORTABLE CHEST 1 VIEW COMPARISON:  January 27, 2020. FINDINGS: The heart size and mediastinal contours are within normal limits. No pneumothorax or pleural effusion is noted. Mild bibasilar opacities are noted concerning for subsegmental atelectasis or possibly infiltrate. The visualized skeletal structures are unremarkable. IMPRESSION: Mild bibasilar opacities are noted concerning for subsegmental atelectasis or possibly infiltrate. Electronically Signed   By: Marijo Conception M.D.   On: 01/30/2020 12:59    Chest x-ray: Independently reviewed.  Bilateral lower lobe infiltrates appreciated  Assessment/Plan  Acute respiratory failure with hypoxia Pneumonia due to to COVID-19: Patient presents with progressively  worsening shortness of breath.  Found to be hypoxic down to 86% requiring nasal cannula oxygen to maintain O2 saturations.  Recent CT angiogram of the chest on 4/23 significant for bilateral infiltrates.  Procalcitonin elevated 0.39.  Patient had received his received his infusion today prior to arrival. -COVID-19 order set utilized -Continuous pulse oximetry with nasal cannula oxygen as needed to maintain O2 saturation greater than 90%  -Follow-up blood and sputum cultures -Albuterol inhaler -Remdesivir per pharmacy -Decadron 6 mg daily  -Vitamin C and zinc -Antitussives as needed -Check inflammatory markers  -Empiric antibiotics of Rocephin and azithromycin due to elevated procalcitonin  Acute kidney injury: Resolving. creatinine 1.75 on 4/23 and recheck today 1.42.  Patient without known history of kidney dysfunction -Check FeNa -IV fluids normal saline at 75 mL/h for 1 L -Recheck kidney function in a.m.  Chest discomfort: Acute.  Patient reported having complaints of chest discomfort with coughing and breathing.  Troponins negative x2.  Suspect secondary to COVID-19 pneumonia. -Check EKG  Hypertensive urgency: On admission blood pressure blood pressure is elevated up to195/105.  Patient reports not being on any blood pressure medications at home. -Labetalol IV as needed elevated blood pressure -May warrant being started on blood pressure medication during this admission  Hyponatremia: Acute.  Sodium initially noted to be 130 on admission.  Suspect likely hypovolemic hyponatremia. -IV fluids as seen above -Recheck sodium levels in a.m.  Hypocalcemia: Acute. -Give 1 g of calcium gluconate -Continue to monitor and replace as needed  Obesity: BMI 38.84 kg/m   DVT prophylaxis: Lovenox Code Status: Full Family Communication: No family requested to be updated at this time Disposition Plan: Likely home in 2 - 4 days once medically stable Consults called: none Admission status:  Inpatient  Norval Morton MD [facility] Pager [phone]   If 7PM-7AM, please contact night-coverage [email or web address] Password [facility]  01/30/2020, 3:38 PM

## 2020-01-30 NOTE — ED Notes (Signed)
HELP GET PATIENT UNDRESS ON THE MONITOR PATIENT IS RESTING WITH CALL BELL IN REACH

## 2020-01-30 NOTE — ED Triage Notes (Signed)
Pt BIB [facility] from infusion clinic w/ c/o SOB, tachy in 130's. Pt COVID+ last week for same. 86% on RA per EMS, placed on 2L [place], 95%. Pt hypertensive 154/124, pulse 133. NAD noted, A&Ox4.

## 2020-01-30 NOTE — ED Provider Notes (Signed)
[place] EMERGENCY DEPARTMENT Provider Note   CSN: [id number] Arrival date & time: 01/30/20  1206     History Chief Complaint  Patient presents with  . Shortness of Breath    Jonathan Casey. is a 49 y.o. male.  The history is provided by the patient and medical records. No language interpreter was used.  Shortness of Breath  Jonathan Casey. is a 49 y.o. male who presents to the Emergency Department complaining of sob. He presents to the with emergency department with progressive shortness of breath. He became sick on Saturday, April 17 with shortness of breath, fever, body aches, loss of taste and smell. He was diagnosed on Tuesday, April 20 with COVID-19 infection. He was at the infusion center today and was referred to the emergency department due to worsening hypoxia and tachycardia. He has been using albuterol, Tessalon and Tylenol at home for symptom management. He complains of significant central chest pain, worsening on exertion. Cough is nonproductive. Denies any vomiting, diarrhea. He denies any medical problems. He takes no routine prescription medications. Symptoms are severe, constant, worsening.     History reviewed. No pertinent past medical history.  There are no problems to display for this patient.   History reviewed. No pertinent surgical history.     History reviewed. No pertinent family history.  Social History   Tobacco Use  . Smoking status: Never Smoker  . Smokeless tobacco: Never Used  Substance Use Topics  . Alcohol use: Never  . Drug use: Never    Home Medications Prior to Admission medications   Medication Sig Start Date End Date Taking? Authorizing Provider  albuterol (VENTOLIN HFA) 108 (90 Base) MCG/ACT inhaler Inhale 2 puffs into the lungs every 6 (six) hours as needed for wheezing or shortness of breath. 01/27/20   Couture, Cortni S, PA-C  Ascorbic Acid (VITAMIN C PO) Take 500 mcg by mouth daily.    [provider]  benzonatate (TESSALON) 100 MG capsule Take 1 capsule (100 mg total) by mouth every 8 (eight) hours for 5 days. 01/27/20 02/01/20  Couture, Cortni S, PA-C  cholecalciferol (VITAMIN D3) 25 MCG (1000 UNIT) tablet Take 1,000 Units by mouth daily.    [provider]  Misc Natural Products (AIRBORNE ELDERBERRY) CHEW Chew 2 tablets by mouth daily.    [provider]    Allergies    Patient has no known allergies.  Review of Systems   Review of Systems  Respiratory: Positive for shortness of breath.   All other systems reviewed and are negative.   Physical Exam Updated Vital Signs BP (!) 195/105   Pulse (!) 125   Temp 98.9 F (37.2 C) (Oral)   Resp (!) 22   Ht 5\' 9"  (1.753 m)   Wt 119.3 kg   SpO2 96%   BMI 38.84 kg/m   Physical Exam Vitals and nursing note reviewed.  Constitutional:      Appearance: He is well-developed.  HENT:     Head: Normocephalic and atraumatic.  Cardiovascular:     Rate and Rhythm: Regular rhythm. Tachycardia present.     Heart sounds: No murmur.  Pulmonary:     Effort: Pulmonary effort is normal. No respiratory distress.     Breath sounds: Normal breath sounds.     Comments: tachypnea Abdominal:     Palpations: Abdomen is soft.     Tenderness: There is no abdominal tenderness. There is no guarding or rebound.  Musculoskeletal:  General: No swelling or tenderness.  Skin:    General: Skin is warm and dry.  Neurological:     Mental Status: He is alert and oriented to person, place, and time.  Psychiatric:        Behavior: Behavior normal.     ED Results / Procedures / Treatments   Labs (all labs ordered are listed, but only abnormal results are displayed) Labs Reviewed  CBC WITH DIFFERENTIAL/PLATELET - Abnormal; Notable for the following components:      Result Value   RBC 7.77 (*)    MCV 66.5 (*)    MCH 21.9 (*)    RDW 18.6 (*)    All other components within normal limits  COMPREHENSIVE METABOLIC  PANEL - Abnormal; Notable for the following components:   Sodium 130 (*)    Chloride 95 (*)    Glucose, Bld 116 (*)    Creatinine, Ser 1.42 (*)    Calcium 8.2 (*)    AST 133 (*)    ALT 53 (*)    GFR calc non Af Amer 57 (*)    All other components within normal limits  D-DIMER, QUANTITATIVE (NOT AT [facility]) - Abnormal; Notable for the following components:   D-Dimer, Quant 1.37 (*)    All other components within normal limits  LACTATE DEHYDROGENASE - Abnormal; Notable for the following components:   LDH 507 (*)    All other components within normal limits  FIBRINOGEN - Abnormal; Notable for the following components:   Fibrinogen 574 (*)    All other components within normal limits  CULTURE, BLOOD (ROUTINE X 2)  CULTURE, BLOOD (ROUTINE X 2)  LACTIC ACID, PLASMA  PROCALCITONIN  LACTIC ACID, PLASMA  FERRITIN  TRIGLYCERIDES  C-REACTIVE PROTEIN  TROPONIN I (HIGH SENSITIVITY)    EKG None ED ECG REPORT   Date: 01/30/2020  Rate: 128  Rhythm: sinus tachycardia  QRS Axis: normal  Intervals: normal  ST/T Wave abnormalities: nonspecific ST changes  Conduction Disutrbances:left posterior fascicular block  Narrative Interpretation:   Old EKG Reviewed: none available  I have personally reviewed the EKG tracing and disagree with the computerized printout as noted. EKG with low voltage QRS  Radiology DG Chest Port 1 View  Result Date: 01/30/2020 CLINICAL DATA:  Shortness of breath, chest pain. EXAM: PORTABLE CHEST 1 VIEW COMPARISON:  January 27, 2020. FINDINGS: The heart size and mediastinal contours are within normal limits. No pneumothorax or pleural effusion is noted. Mild bibasilar opacities are noted concerning for subsegmental atelectasis or possibly infiltrate. The visualized skeletal structures are unremarkable. IMPRESSION: Mild bibasilar opacities are noted concerning for subsegmental atelectasis or possibly infiltrate. Electronically Signed   By: Marijo Conception M.D.   On:  01/30/2020 12:59    Procedures Procedures (including critical care time)  Medications Ordered in ED Medications  acetaminophen (TYLENOL) tablet 650 mg (650 mg Oral Given 01/30/20 1315)    ED Course  I have reviewed the triage vital signs and the nursing notes.  Pertinent labs & imaging results that were available during my care of the patient were reviewed by me and considered in my medical decision making (see chart for details).    MDM Rules/Calculators/A&P                     Patient with recent diagnosis of COVID-19 infection here for evaluation of tachycardia and hypoxia from infusion center. He is tachycardic on evaluation with a heart rate in the 120s to 130s. EKG  with low voltage sinus tachycardia. He does have a 2 L oxygen requirement. Given hypoxia, persistent tachycardia plan to admit for observation, further treatment.   Vasiliy Crear. was evaluated in Emergency Department on 01/30/2020 for the symptoms described in the history of present illness. He was evaluated in the context of the global COVID-19 pandemic, which necessitated consideration that the patient might be at risk for infection with the SARS-CoV-2 virus that causes COVID-19. Institutional protocols and algorithms that pertain to the evaluation of patients at risk for COVID-19 are in a state of rapid change based on information released by regulatory bodies including the CDC and federal and state organizations. These policies and algorithms were followed during the patient's care in the ED.  Final Clinical Impression(s) / ED Diagnoses Final diagnoses:  None    Rx / DC Orders ED Discharge Orders    None       Quintella Reichert, MD 01/30/20 [phone]

## 2020-01-30 NOTE — Discharge Instructions (Signed)
10 Things You Can Do to Manage Your COVID-19 Symptoms at Home If you have possible or confirmed COVID-19: 1. Stay home from work and school. And stay away from other public places. If you must go out, avoid using any kind of public transportation, ridesharing, or taxis. 2. Monitor your symptoms carefully. If your symptoms get worse, call your healthcare provider immediately. 3. Get rest and stay hydrated. 4. If you have a medical appointment, call the healthcare provider ahead of time and tell them that you have or may have COVID-19. 5. For medical emergencies, call 911 and notify the dispatch personnel that you have or may have COVID-19. 6. Cover your cough and sneezes with a tissue or use the inside of your elbow. 7. Wash your hands often with soap and water for at least 20 seconds or clean your hands with an alcohol-based hand sanitizer that contains at least 60% alcohol. 8. As much as possible, stay in a specific room and away from other people in your home. Also, you should use a separate bathroom, if available. If you need to be around other people in or outside of the home, wear a mask. 9. Avoid sharing personal items with other people in your household, like dishes, towels, and bedding. 10. Clean all surfaces that are touched often, like counters, tabletops, and doorknobs. Use household cleaning sprays or wipes according to the label instructions. [email or web address] What types of side effects do monoclonal antibody drugs cause?  Common side effects  In general, the more common side effects caused by monoclonal antibody drugs include: . Allergic reactions, such as hives or itching . Flu-like signs and symptoms, including chills, fatigue, fever, and muscle aches and pains . Nausea, vomiting . Diarrhea . Skin rashes . Low blood pressure   The CDC is recommending patients who receive monoclonal antibody treatments wait at least 90 days before being vaccinated.  Currently, there are no  data on the safety and efficacy of mRNA COVID-19 vaccines in persons who received monoclonal antibodies or convalescent plasma as part of COVID-19 treatment. Based on the estimated half-life of such therapies as well as evidence suggesting that reinfection is uncommon in the 90 days after initial infection, vaccination should be deferred for at least 90 days, as a precautionary measure until additional information becomes available, to avoid interference of the antibody treatment with vaccine-induced immune responses.

## 2020-01-30 NOTE — Progress Notes (Signed)
Pt arrived with SOB, coughing, and HR in 130s. MD notified, [place] NS 1L was given and was ok to give BAM. During infusion, patient went 5 times to the bathroom (Urinate), after which he could not due to increased SOB. O2 dropped as well. On 7th time going to the bathroom O2 is 86. [place] was applied 2L. MD notified again and said to watch him for another hour. VS retaken and Ambulating O2 VS taken. On RA 86%. 90% on 2L. Increased to 4L. HR in 130s, SBP 160s. 96%. States small tightness in the chest. EMS called and pt is transferred to the hospital ED.

## 2020-01-30 NOTE — ED Notes (Signed)
Pt complaining of leg cramping/discomfort, pt placed on hospital bed.

## 2020-01-31 DIAGNOSIS — I160 Hypertensive urgency: Secondary | ICD-10-CM | POA: Diagnosis present

## 2020-01-31 DIAGNOSIS — E669 Obesity, unspecified: Secondary | ICD-10-CM | POA: Diagnosis present

## 2020-01-31 DIAGNOSIS — E871 Hypo-osmolality and hyponatremia: Secondary | ICD-10-CM | POA: Diagnosis present

## 2020-01-31 DIAGNOSIS — R0789 Other chest pain: Secondary | ICD-10-CM | POA: Diagnosis present

## 2020-01-31 DIAGNOSIS — N179 Acute kidney failure, unspecified: Secondary | ICD-10-CM | POA: Diagnosis present

## 2020-01-31 DIAGNOSIS — J9601 Acute respiratory failure with hypoxia: Secondary | ICD-10-CM | POA: Diagnosis present

## 2020-01-31 LAB — COMPREHENSIVE METABOLIC PANEL
ALT: 58 U/L — ABNORMAL HIGH (ref 0–44)
AST: 124 U/L — ABNORMAL HIGH (ref 15–41)
Albumin: 2.9 g/dL — ABNORMAL LOW (ref 3.5–5.0)
Alkaline Phosphatase: 55 U/L (ref 38–126)
Anion gap: 14 (ref 5–15)
BUN: 13 mg/dL (ref 6–20)
CO2: 21 mmol/L — ABNORMAL LOW (ref 22–32)
Calcium: 8.1 mg/dL — ABNORMAL LOW (ref 8.9–10.3)
Chloride: 96 mmol/L — ABNORMAL LOW (ref 98–111)
Creatinine, Ser: 1.29 mg/dL — ABNORMAL HIGH (ref 0.61–1.24)
GFR calc Af Amer: >60 mL/min (ref >60)
GFR calc non Af Amer: >60 mL/min (ref >60)
Glucose, Bld: 158 mg/dL — ABNORMAL HIGH (ref 70–99)
Potassium: 4.4 mmol/L (ref 3.5–5.1)
Sodium: 131 mmol/L — ABNORMAL LOW (ref 135–145)
Total Bilirubin: 0.7 mg/dL (ref 0.3–1.2)
Total Protein: 6.6 g/dL (ref 6.5–8.1)

## 2020-01-31 LAB — CBC WITH DIFFERENTIAL/PLATELET
Abs Immature Granulocytes: 0 10*3/uL (ref 0.00–0.07)
Basophils Absolute: 0 10*3/uL (ref 0.0–0.1)
Basophils Relative: 0 %
Eosinophils Absolute: 0 10*3/uL (ref 0.0–0.5)
Eosinophils Relative: 0 %
HCT: 48.4 % (ref 39.0–52.0)
Hemoglobin: 15.6 g/dL (ref 13.0–17.0)
Lymphocytes Relative: 9 %
Lymphs Abs: 0.4 10*3/uL — ABNORMAL LOW (ref 0.7–4.0)
MCH: 21.1 pg — ABNORMAL LOW (ref 26.0–34.0)
MCHC: 32.2 g/dL (ref 30.0–36.0)
MCV: 65.3 fL — ABNORMAL LOW (ref 80.0–100.0)
Monocytes Absolute: 0.3 10*3/uL (ref 0.1–1.0)
Monocytes Relative: 8 %
Neutro Abs: 3.6 10*3/uL (ref 1.7–7.7)
Neutrophils Relative %: 83 %
Platelets: 214 10*3/uL (ref 150–400)
RBC: 7.41 MIL/uL — ABNORMAL HIGH (ref 4.22–5.81)
RDW: 18.3 % — ABNORMAL HIGH (ref 11.5–15.5)
WBC: 4.3 10*3/uL (ref 4.0–10.5)
nRBC: 0 % (ref 0.0–0.2)
nRBC: 0 /100 WBC

## 2020-01-31 LAB — MAGNESIUM: Magnesium: 1.9 mg/dL (ref 1.7–2.4)

## 2020-01-31 LAB — FERRITIN: Ferritin: 1159 ng/mL — ABNORMAL HIGH (ref 24–336)

## 2020-01-31 LAB — PHOSPHORUS: Phosphorus: 2.8 mg/dL (ref 2.5–4.6)

## 2020-01-31 LAB — C-REACTIVE PROTEIN: CRP: 15.7 mg/dL — ABNORMAL HIGH (ref <1.0)

## 2020-01-31 LAB — D-DIMER, QUANTITATIVE: D-Dimer, Quant: 1 ug/mL-FEU — ABNORMAL HIGH (ref 0.00–0.50)

## 2020-01-31 MED ORDER — DEXAMETHASONE SODIUM PHOSPHATE 10 MG/ML IJ SOLN
10.0000 mg | Freq: Every day | INTRAMUSCULAR | Status: DC
  Administered 2020-02-01 – 2020-02-04 (×4): 10 mg via INTRAVENOUS
  Filled 2020-01-31 (×4): qty 1

## 2020-01-31 NOTE — Progress Notes (Signed)
   01/31/20 0829  Assess: MEWS Score  Temp 99.1 F (37.3 C)  BP (!) 132/98  Pulse Rate (!) 104  Resp (!) 22  SpO2 96 %  O2 Device Nasal Cannula  O2 Flow Rate (L/min) 2 L/min  Assess: MEWS Score  MEWS Temp 0  MEWS Systolic 0  MEWS Pulse 1  MEWS RR 1  MEWS LOC 0  MEWS Score 2  MEWS Score Color Yellow  Assess: if the MEWS score is Yellow or Red  Were vital signs taken at a resting state? Yes  Focused Assessment Documented focused assessment  Early Detection of Sepsis Score *See Row Information* High  MEWS guidelines implemented *See Row Information* Yes  Treat  MEWS Interventions Other (Comment);Administered scheduled meds/treatments (paged MD)  Take Vital Signs  Increase Vital Sign Frequency  Yellow: Q 2hr X 2 then Q 4hr X 2, if remains yellow, continue Q 4hrs  Escalate  MEWS: Escalate Yellow: discuss with charge nurse/RN and consider discussing with provider and RRT  Notify: Provider  Provider Name/Title Elgergawy  Date Provider Notified 01/31/20  Time Provider Notified [phone]  Notification Type Page  Notification Reason Change in status  Response No new orders

## 2020-01-31 NOTE — Progress Notes (Signed)
Jonathan Casey. is a 49 y.o. male patient admitted from ED awake, alert - oriented  X 4 - no acute distress noted.  VSS - Blood pressure 138/87, pulse (!) 104, temperature 99.1 F (37.3 C), temperature source Oral, resp. rate (!) 22, height 5\' 9"  (1.753 m), weight 119.3 kg, SpO2 96 %.    IVs in place, occlusive dsg intact without redness.   Call light within reach, patient able to voice, and demonstrate understanding.  Skin, clean-dry- intact without evidence of bruising, or skin tears.   No evidence of skin break down noted on exam.   Will cont to eval and treat per MD orders.  Jeanella Craze, RN 01/31/2020 9:57 AM

## 2020-01-31 NOTE — Progress Notes (Signed)
PROGRESS NOTE                                                                                                                                                                                                             Patient Demographics:    Jonathan Casey, is a 49 y.o. male, DOB - 1971-02-08, [id number]  Admit date - 01/30/2020   Admitting Physician Norval Morton, MD  Outpatient Primary MD for the patient is Patient, No Pcp Per  LOS - 1   Chief Complaint  Patient presents with  . Shortness of Breath       Brief Narrative    49 y.o. male without significant past medical history who presented to the hospital with complaints of progressive shortness of breath over the last 10 days.  Patient was diagnosed with COVID-19 4/20, he did receive monoclonal antibody at [facility] infusion center yesterday for/26, where he was noted to be hypoxic, he was sent to ED for further evaluation..   Subjective:    Jonathan Casey today for some cough, dyspnea mainly with activity, denies any chest pain.   Assessment  & Plan :    Principal Problem:   Acute respiratory failure with hypoxia (HCC) Active Problems:   Pneumonia due to COVID-19 virus   AKI (acute kidney injury) ([place])   Hypertensive urgency   Chest discomfort   Hyponatremia   Obesity (BMI 30-39.9)  Acute respiratory failure with hypoxia due to to COVID-19Pneumonia  :  - Patient presents with progressively worsening shortness of breath.  Found to be hypoxic down to 86% requiring nasal cannula oxygen to maintain O2 saturations.  Recent CT angiogram of the chest on 4/23 significant for bilateral infiltrates.  Procalcitonin elevated 0.39.  Patient had received his received his infusion today prior to arrival. -Follow-up blood and sputum cultures -Continue with IV Remdesivir. -On Decadron 6 mg oral daily, will change to 10 mg IV daily. -Vitamin C and zinc -Antitussives as needed -Empiric  antibiotics of Rocephin and azithromycin due to elevated procalcitonin -Patient currently on 2 to 3 L nasal cannula, so far I will hold on Actemra, will monitor closely, if increased oxygen requirement then will discussed Actemra with the patient.  Usually with significantly elevated inflammatory markers with CRP of 17 on admission.  Acute kidney injury:  -Resolving. creatinine 1.75 on 4/23  and recheck today 1.42.  Patient without known history of kidney dysfunction  Chest discomfort:  -Acute.  Patient reported having complaints of chest discomfort with coughing and breathing.  Troponins negative x2.  Suspect secondary to COVID-19 pneumonia.  Hypertensive urgency/Hypertension  - on admission blood pressure blood pressure is elevated up to195/105.  Patient reports not being on any blood pressure medications at home. -Labetalol IV as needed elevated blood pressure -May warrant being started on blood pressure medication during this admission  Hyponatremia: Acute.  Sodium initially noted to be 130 on admission.  Suspect likely hypovolemic hyponatremia.  Hypocalcemia: Acute. -recieved 1 g of calcium gluconate -Continue to monitor and replace as needed  Obesity: BMI 38.84 kg/m   COVID-19 Labs  Recent Labs    01/30/20 1317 01/31/20 0520  DDIMER 1.37* 1.00*  FERRITIN 1,082* 1,159*  LDH 507*  --   CRP 17.6* 15.7*    No results found for: SARSCOV2NAA   Code Status : Full  Family Communication  : She is coherent and appropriate, I have discussed his condition with him at length, and updated him.  Disposition Plan  :  Status is: Inpatient  Remains inpatient appropriate because:Hemodynamically unstable   Dispo: The patient is from: Home              Anticipated d/c is to: Home              Anticipated d/c date is: > 3 days              Patient currently is not medically stable to d/c.  Consults  :  None  Procedures  : None  DVT Prophylaxis  :  [place] lovenox  Lab  Results  Component Value Date   PLT 214 01/31/2020    Antibiotics  :    Anti-infectives (From admission, onward)   Start     Dose/Rate Route Frequency Ordered Stop   01/31/20 1000  remdesivir 100 mg in sodium chloride 0.9 % 100 mL IVPB     100 mg 200 mL/hr over 30 Minutes Intravenous Daily 01/30/20 1550 02/04/20 0959   01/30/20 1700  remdesivir 200 mg in sodium chloride 0.9% 250 mL IVPB     200 mg 580 mL/hr over 30 Minutes Intravenous Once 01/30/20 1550 01/30/20 1846   01/30/20 1630  cefTRIAXone (ROCEPHIN) 2 g in sodium chloride 0.9 % 100 mL IVPB     2 g 200 mL/hr over 30 Minutes Intravenous Every 24 hours 01/30/20 1551     01/30/20 1630  azithromycin (ZITHROMAX) tablet 500 mg     500 mg Oral Daily 01/30/20 1601          Objective:   Vitals:   01/31/20 0829 01/31/20 1013 01/31/20 1212 01/31/20 1602  BP: (!) 132/98 (!) 124/92 127/87 109/65  Pulse: (!) 104 (!) 107 (!) 105 99  Resp: (!) 22 (!) 23 17 (!) 21  Temp: 99.1 F (37.3 C) 98.8 F (37.1 C) 97.9 F (36.6 C) 98.1 F (36.7 C)  TempSrc: Oral Oral Oral Oral  SpO2: 96% 90% 94% 90%  Weight:      Height:        Wt Readings from Last 3 Encounters:  01/30/20 119.3 kg  01/29/20 119.3 kg  01/27/20 119.3 kg     Intake/Output Summary (Last 24 hours) at 01/31/2020 1643 Last data filed at 01/31/2020 1503 Gross per 24 hour  Intake 876.27 ml  Output --  Net 876.27 ml     Physical  Exam  Awake Alert, Oriented X 3, No new F.N deficits, Normal affect Symmetrical Chest wall movement, Good air movement bilaterally, CTAB RRR,No Gallops,Rubs or new Murmurs, No Parasternal Heave +ve B.Sounds, Abd Soft, No tenderness, No rebound - guarding or rigidity. No Cyanosis, Clubbing or edema, No new Rash or bruise      Data Review:    CBC Recent Labs  Lab 01/27/20 1207 01/30/20 1317 01/31/20 0520  WBC 5.3 5.7 4.3  HGB 16.7 17.0 15.6  HCT 52.4* 51.7 48.4  PLT 194 202 214  MCV 66.3* 66.5* 65.3*  MCH 21.1* 21.9* 21.1*  MCHC  31.9 32.9 32.2  RDW 19.0* 18.6* 18.3*  LYMPHSABS 0.9 0.8 0.4*  MONOABS 0.6 0.4 0.3  EOSABS 0.0 0.1 0.0  BASOSABS 0.0 0.0 0.0    Chemistries  Recent Labs  Lab 01/27/20 1207 01/30/20 1317 01/31/20 0520  NA 135 130* 131*  K 4.2 4.4 4.4  CL 97* 95* 96*  CO2 29 22 21*  GLUCOSE 163* 116* 158*  BUN 12 10 13   CREATININE 1.75* 1.42* 1.29*  CALCIUM 9.0 8.2* 8.1*  MG  --   --  1.9  AST 34 133* 124*  ALT 26 53* 58*  ALKPHOS 69 65 55  BILITOT 0.7 1.1 0.7   ------------------------------------------------------------------------------------------------------------------ Recent Labs    01/30/20 1317  TRIG 69    No results found for: HGBA1C ------------------------------------------------------------------------------------------------------------------ No results for input(s): TSH, T4TOTAL, T3FREE, THYROIDAB in the last 72 hours.  Invalid input(s): FREET3 ------------------------------------------------------------------------------------------------------------------ Recent Labs    01/30/20 1317 01/31/20 0520  FERRITIN 1,082* 1,159*    Coagulation profile No results for input(s): INR, PROTIME in the last 168 hours.  Recent Labs    01/30/20 1317 01/31/20 0520  DDIMER 1.37* 1.00*    Cardiac Enzymes No results for input(s): CKMB, TROPONINI, MYOGLOBIN in the last 168 hours.  Invalid input(s): CK ------------------------------------------------------------------------------------------------------------------ No results found for: BNP  Inpatient Medications  Scheduled Meds: . albuterol  2 puff Inhalation Q6H  . vitamin C  500 mg Oral Daily  . azithromycin  500 mg Oral Daily  . dexamethasone  6 mg Oral Q24H  . enoxaparin (LOVENOX) injection  40 mg Subcutaneous Q24H  . famotidine  20 mg Oral BID  . sodium chloride flush  3 mL Intravenous Q12H  . zinc sulfate  220 mg Oral Daily   Continuous Infusions: . cefTRIAXone (ROCEPHIN)  IV 2 g (01/31/20 1604)  .  remdesivir 100 mg in NS 100 mL 100 mg (01/31/20 0917)   PRN Meds:.acetaminophen, chlorpheniramine-HYDROcodone, guaiFENesin-dextromethorphan, labetalol, ondansetron **OR** ondansetron (ZOFRAN) IV  Micro Results Recent Results (from the past 240 hour(s))  Blood Culture (routine x 2)     Status: None (Preliminary result)   Collection Time: 01/30/20  1:17 PM   Specimen: BLOOD  Result Value Ref Range Status   Specimen Description BLOOD RIGHT ANTECUBITAL  Final   Special Requests   Final    BOTTLES DRAWN AEROBIC AND ANAEROBIC Blood Culture adequate volume   Culture   Final    NO GROWTH < 24 HOURS Performed at [facility], [address], [place], [place] [address]    Report Status PENDING  Incomplete  Blood Culture (routine x 2)     Status: None (Preliminary result)   Collection Time: 01/30/20  2:29 PM   Specimen: BLOOD LEFT HAND  Result Value Ref Range Status   Specimen Description BLOOD LEFT HAND  Final   Special Requests   Final  BOTTLES DRAWN AEROBIC AND ANAEROBIC Blood Culture results may not be optimal due to an inadequate volume of blood received in culture bottles   Culture   Final    NO GROWTH < 24 HOURS Performed at [facility] [address], [place], [place] [address]    Report Status PENDING  Incomplete    Radiology Reports CT Angio Chest PE W and/or Wo Contrast  Result Date: 01/27/2020 CLINICAL DATA:  Chills and elevated D-dimer. EXAM: CT ANGIOGRAPHY CHEST WITH CONTRAST TECHNIQUE: Multidetector CT imaging of the chest was performed using the standard protocol during bolus administration of intravenous contrast. Multiplanar CT image reconstructions and MIPs were obtained to evaluate the vascular anatomy. CONTRAST:  15mL OMNIPAQUE IOHEXOL 300 MG/ML  SOLN COMPARISON:  None. FINDINGS: Cardiovascular: Satisfactory opacification of the pulmonary arteries to the segmental level. No evidence of pulmonary embolism. Normal heart size. No pericardial effusion.  Mediastinum/Nodes: No enlarged mediastinal, hilar, or axillary lymph nodes. Thyroid gland, trachea, and esophagus demonstrate no significant findings. Lungs/Pleura: Numerous small multifocal infiltrates are seen scattered throughout both lungs. Very small bilateral pleural effusions are seen. No pneumothorax is identified. Upper Abdomen: No acute abnormality. Musculoskeletal: No chest wall abnormality. Multilevel degenerative changes are seen within the mid to lower thoracic spine. Review of the MIP images confirms the above findings. IMPRESSION: 1. Numerous small multifocal infiltrates scattered throughout both lungs. 2. Very small bilateral pleural effusions. Electronically Signed   By: Virgina Norfolk M.D.   On: 01/27/2020 21:18   DG Chest Port 1 View  Result Date: 01/30/2020 CLINICAL DATA:  Shortness of breath, chest pain. EXAM: PORTABLE CHEST 1 VIEW COMPARISON:  January 27, 2020. FINDINGS: The heart size and mediastinal contours are within normal limits. No pneumothorax or pleural effusion is noted. Mild bibasilar opacities are noted concerning for subsegmental atelectasis or possibly infiltrate. The visualized skeletal structures are unremarkable. IMPRESSION: Mild bibasilar opacities are noted concerning for subsegmental atelectasis or possibly infiltrate. Electronically Signed   By: Marijo Conception M.D.   On: 01/30/2020 12:59   DG Chest Portable 1 View  Result Date: 01/27/2020 CLINICAL DATA:  Chills. Positive COVID test by report. Started feeling unwell 2 days ago. EXAM: PORTABLE CHEST 1 VIEW COMPARISON:  None. FINDINGS: Normal heart, mediastinum and hila. Clear lungs.  No pleural effusion or pneumothorax. Skeletal structures are grossly intact. IMPRESSION: No active disease. Electronically Signed   By: Lajean Manes M.D.   On: 01/27/2020 15:00      Phillips Climes M.D on 01/31/2020 at 4:43 PM  Between 7am to 7pm - Pager - [phone]  After 7pm go to [email or web address] - password [facility]  [facility] -  Office  [phone]

## 2020-02-01 DIAGNOSIS — R0789 Other chest pain: Secondary | ICD-10-CM

## 2020-02-01 DIAGNOSIS — I160 Hypertensive urgency: Secondary | ICD-10-CM

## 2020-02-01 DIAGNOSIS — J9601 Acute respiratory failure with hypoxia: Secondary | ICD-10-CM

## 2020-02-01 DIAGNOSIS — N179 Acute kidney failure, unspecified: Secondary | ICD-10-CM

## 2020-02-01 LAB — COMPREHENSIVE METABOLIC PANEL
ALT: 56 U/L — ABNORMAL HIGH (ref 0–44)
AST: 97 U/L — ABNORMAL HIGH (ref 15–41)
Albumin: 2.8 g/dL — ABNORMAL LOW (ref 3.5–5.0)
Alkaline Phosphatase: 49 U/L (ref 38–126)
Anion gap: 10 (ref 5–15)
BUN: 20 mg/dL (ref 6–20)
CO2: 23 mmol/L (ref 22–32)
Calcium: 8.5 mg/dL — ABNORMAL LOW (ref 8.9–10.3)
Chloride: 102 mmol/L (ref 98–111)
Creatinine, Ser: 1.23 mg/dL (ref 0.61–1.24)
GFR calc Af Amer: >60 mL/min (ref >60)
GFR calc non Af Amer: >60 mL/min (ref >60)
Glucose, Bld: 193 mg/dL — ABNORMAL HIGH (ref 70–99)
Potassium: 4.5 mmol/L (ref 3.5–5.1)
Sodium: 135 mmol/L (ref 135–145)
Total Bilirubin: 0.6 mg/dL (ref 0.3–1.2)
Total Protein: 6.2 g/dL — ABNORMAL LOW (ref 6.5–8.1)

## 2020-02-01 LAB — CBC WITH DIFFERENTIAL/PLATELET
Abs Immature Granulocytes: 0.03 10*3/uL (ref 0.00–0.07)
Basophils Absolute: 0 10*3/uL (ref 0.0–0.1)
Basophils Relative: 0 %
Eosinophils Absolute: 0 10*3/uL (ref 0.0–0.5)
Eosinophils Relative: 0 %
HCT: 47.4 % (ref 39.0–52.0)
Hemoglobin: 15.5 g/dL (ref 13.0–17.0)
Immature Granulocytes: 1 %
Lymphocytes Relative: 19 %
Lymphs Abs: 0.8 10*3/uL (ref 0.7–4.0)
MCH: 21 pg — ABNORMAL LOW (ref 26.0–34.0)
MCHC: 32.7 g/dL (ref 30.0–36.0)
MCV: 64.3 fL — ABNORMAL LOW (ref 80.0–100.0)
Monocytes Absolute: 0.4 10*3/uL (ref 0.1–1.0)
Monocytes Relative: 9 %
Neutro Abs: 2.9 10*3/uL (ref 1.7–7.7)
Neutrophils Relative %: 71 %
Platelets: 259 10*3/uL (ref 150–400)
RBC: 7.37 MIL/uL — ABNORMAL HIGH (ref 4.22–5.81)
RDW: 18.2 % — ABNORMAL HIGH (ref 11.5–15.5)
WBC: 4.1 10*3/uL (ref 4.0–10.5)
nRBC: 0 % (ref 0.0–0.2)

## 2020-02-01 LAB — MAGNESIUM: Magnesium: 2.2 mg/dL (ref 1.7–2.4)

## 2020-02-01 LAB — PHOSPHORUS: Phosphorus: 3.6 mg/dL (ref 2.5–4.6)

## 2020-02-01 LAB — D-DIMER, QUANTITATIVE: D-Dimer, Quant: 0.74 ug/mL-FEU — ABNORMAL HIGH (ref 0.00–0.50)

## 2020-02-01 LAB — FERRITIN: Ferritin: 1046 ng/mL — ABNORMAL HIGH (ref 24–336)

## 2020-02-01 LAB — C-REACTIVE PROTEIN: CRP: 7.6 mg/dL — ABNORMAL HIGH (ref <1.0)

## 2020-02-01 NOTE — Progress Notes (Signed)
PROGRESS NOTE                                                                                                                                                                                                             Patient Demographics:    Jonathan Casey, is a 49 y.o. male, DOB - [birth year], [id number]  Outpatient Primary MD for the patient is Patient, No Pcp Per   Admit date - 01/30/2020   LOS - 2  Chief Complaint  Patient presents with  . Shortness of Breath       Brief Narrative: Patient is a 49 y.o. male without any significant past medical history-who presented with hypoxemia secondary to COVID-19 pneumonia.  See below for further details.  Significant Events: 4/20>> COVID-19 positive 4/23>> CT angio chest-negative for PE, multifocal infiltrates 4/26>> monoclonal antibody infusion 4/26>> admit to [facility] for hypoxia secondary to COVID-19 pneumonia  COVID-19 medications: Steroids: 4/26>> Remdesivir: 4/26  Antibiotics: Rocephin: 4/26>> Zithromax: 4/26>> 4/28  Microbiology data: 4/26: Blood culture>> negative  DVT prophylaxis: SQ Lovenox  Procedures: None  Consults: None    Subjective:    Jonathan Casey today continues to have coughing spells-he really appears comfortable at rest and does not have shortness of breath.   Assessment  & Plan :   Acute Hypoxic Resp Failure due to Covid 19 Viral pneumonia: Appears stable on 2 L of oxygen-CRP downtrending-continues to have coughing spells but otherwise seems comfortable.  Plans are to continue steroids and remdesivir.  Remains on empiric antibiotics for mildly elevated procalcitonin level-can stop Zithromax today-continue Rocephin for a few more days.  Continue with attempts to titrate down FiO2.  Fever: afebrile  O2 requirements:  SpO2: 94 % O2 Flow Rate (L/min): 2 L/min   COVID-19 Labs: Recent Labs    01/30/20 1317 01/31/20 0520  02/01/20 0615  DDIMER 1.37* 1.00* 0.74*  FERRITIN 1,082* 1,159* 1,046*  LDH 507*  --   --   CRP 17.6* 15.7* 7.6*    No results found for: BNP  Recent Labs  Lab 01/30/20 1317  PROCALCITON 0.39    No results found for: SARSCOV2NAA   Prone/Incentive Spirometry: encouragedincentive spirometry use 3-4/hour.  AKI: Hemodynamically mediated-resolved.  Hyponatremia: Likely secondary to dehydration-resolved.  Hypocalcemia: Mild-corrected to albumin-almost normalizes.  Follow for now.  Not sure why patient has low albumin levels-probably  secondary to acute illness in the setting of COVID-19 pneumonia.  Also has some amount of mild proteinuria probably due to COVID-19- will need follow-up with PCP and repeat UA once patient has recovered from acute illness.  Chest discomfort: Atypical-troponins/EKG negative.  No further work-up required.  Etiology felt to be musculoskeletal in nature due to persistent coughing spells.  Hypertensive urgency: Blood pressure significantly elevated on initial presentation-currently stable without the use of any antihypertensive medications.  Follow for now.  Obesity: Estimated body mass index is 38.09 kg/m as calculated from the following:   Height as of this encounter: 5\' 9"  (1.753 m).   Weight as of this encounter: 117 kg.  ABG: No results found for: PHART, PCO2ART, PO2ART, HCO3, TCO2, ACIDBASEDEF, O2SAT  Vent Settings: N/A  Condition - Stable  Family Communication  : Patient to update family himself.  Code Status :  Full Code  Diet :  Diet Order            Diet regular Room service appropriate? Yes; Fluid consistency: Thin  Diet effective now               Disposition Plan  :    Remains inpatient appropriate because:Inpatient level of care appropriate due to severity of illness   Dispo: The patient is from: Home              Anticipated d/c is to: Home              Anticipated d/c date is: 2 days              Patient currently is  not medically stable to d/c.          Barriers to discharge: Hypoxia requiring O2 supplementation/complete 5 days of IV Remdesivir  Antimicorbials  :    Anti-infectives (From admission, onward)   Start     Dose/Rate Route Frequency Ordered Stop   01/31/20 1000  remdesivir 100 mg in sodium chloride 0.9 % 100 mL IVPB     100 mg 200 mL/hr over 30 Minutes Intravenous Daily 01/30/20 1550 02/04/20 0959   01/30/20 1700  remdesivir 200 mg in sodium chloride 0.9% 250 mL IVPB     200 mg 580 mL/hr over 30 Minutes Intravenous Once 01/30/20 1550 01/30/20 1846   01/30/20 1630  cefTRIAXone (ROCEPHIN) 2 g in sodium chloride 0.9 % 100 mL IVPB     2 g 200 mL/hr over 30 Minutes Intravenous Every 24 hours 01/30/20 1551     01/30/20 1630  azithromycin (ZITHROMAX) tablet 500 mg     500 mg Oral Daily 01/30/20 1601        Inpatient Medications  Scheduled Meds: . albuterol  2 puff Inhalation Q6H  . vitamin C  500 mg Oral Daily  . azithromycin  500 mg Oral Daily  . dexamethasone (DECADRON) injection  10 mg Intravenous Daily  . enoxaparin (LOVENOX) injection  40 mg Subcutaneous Q24H  . famotidine  20 mg Oral BID  . sodium chloride flush  3 mL Intravenous Q12H  . zinc sulfate  220 mg Oral Daily   Continuous Infusions: . cefTRIAXone (ROCEPHIN)  IV 2 g (01/31/20 1604)  . remdesivir 100 mg in NS 100 mL 100 mg (02/01/20 0840)   PRN Meds:.acetaminophen, chlorpheniramine-HYDROcodone, guaiFENesin-dextromethorphan, labetalol, ondansetron **OR** ondansetron (ZOFRAN) IV   Time Spent in minutes  25  See all Orders from today for further details   Jonathan Casey M.D on 02/01/2020 at 11:31 AM  To  page go to [email or web address] - use universal password  [facility] -  Office  [phone]    Objective:   Vitals:   01/31/20 1602 01/31/20 2057 02/01/20 0522 02/01/20 0934  BP: 109/65 120/71  128/90  Pulse: 99 (!) 106  93  Resp: (!) 21 (!) 28  18  Temp: 98.1 F (36.7 C) 99.4 F (37.4 C) 98.9  F (37.2 C) 98.6 F (37 C)  TempSrc: Oral Oral Oral Oral  SpO2: 90% (!) 88%  94%  Weight:   117 kg   Height:        Wt Readings from Last 3 Encounters:  02/01/20 117 kg  01/29/20 119.3 kg  01/27/20 119.3 kg     Intake/Output Summary (Last 24 hours) at 02/01/2020 1131 Last data filed at 02/01/2020 1002 Gross per 24 hour  Intake 907.89 ml  Output 1075 ml  Net -167.11 ml     Physical Exam Gen Exam:Alert awake-not in any distress HEENT:atraumatic, normocephalic Chest: B/L clear to auscultation anteriorly CVS:S1S2 regular Abdomen:soft non tender, non distended Extremities:no edema Neurology: Non focal Skin: no rash   Data Review:    CBC Recent Labs  Lab 01/27/20 1207 01/30/20 1317 01/31/20 0520 02/01/20 0615  WBC 5.3 5.7 4.3 4.1  HGB 16.7 17.0 15.6 15.5  HCT 52.4* 51.7 48.4 47.4  PLT 194 202 214 259  MCV 66.3* 66.5* 65.3* 64.3*  MCH 21.1* 21.9* 21.1* 21.0*  MCHC 31.9 32.9 32.2 32.7  RDW 19.0* 18.6* 18.3* 18.2*  LYMPHSABS 0.9 0.8 0.4* 0.8  MONOABS 0.6 0.4 0.3 0.4  EOSABS 0.0 0.1 0.0 0.0  BASOSABS 0.0 0.0 0.0 0.0    Chemistries  Recent Labs  Lab 01/27/20 1207 01/30/20 1317 01/31/20 0520 02/01/20 0615  NA 135 130* 131* 135  K 4.2 4.4 4.4 4.5  CL 97* 95* 96* 102  CO2 29 22 21* 23  GLUCOSE 163* 116* 158* 193*  BUN 12 10 13 20   CREATININE 1.75* 1.42* 1.29* 1.23  CALCIUM 9.0 8.2* 8.1* 8.5*  MG  --   --  1.9 2.2  AST 34 133* 124* 97*  ALT 26 53* 58* 56*  ALKPHOS 69 65 55 49  BILITOT 0.7 1.1 0.7 0.6   ------------------------------------------------------------------------------------------------------------------ Recent Labs    01/30/20 1317  TRIG 69    No results found for: HGBA1C ------------------------------------------------------------------------------------------------------------------ No results for input(s): TSH, T4TOTAL, T3FREE, THYROIDAB in the last 72 hours.  Invalid input(s):  FREET3 ------------------------------------------------------------------------------------------------------------------ Recent Labs    01/31/20 0520 02/01/20 0615  FERRITIN 1,159* 1,046*    Coagulation profile No results for input(s): INR, PROTIME in the last 168 hours.  Recent Labs    01/31/20 0520 02/01/20 0615  DDIMER 1.00* 0.74*    Cardiac Enzymes No results for input(s): CKMB, TROPONINI, MYOGLOBIN in the last 168 hours.  Invalid input(s): CK ------------------------------------------------------------------------------------------------------------------ No results found for: BNP  Micro Results Recent Results (from the past 240 hour(s))  Blood Culture (routine x 2)     Status: None (Preliminary result)   Collection Time: 01/30/20  1:17 PM   Specimen: BLOOD  Result Value Ref Range Status   Specimen Description BLOOD RIGHT ANTECUBITAL  Final   Special Requests   Final    BOTTLES DRAWN AEROBIC AND ANAEROBIC Blood Culture adequate volume   Culture   Final    NO GROWTH 2 DAYS Performed at [facility], [address], [place], [place] [address]    Report Status PENDING  Incomplete  Blood Culture (routine x  2)     Status: None (Preliminary result)   Collection Time: 01/30/20  2:29 PM   Specimen: BLOOD LEFT HAND  Result Value Ref Range Status   Specimen Description BLOOD LEFT HAND  Final   Special Requests   Final    BOTTLES DRAWN AEROBIC AND ANAEROBIC Blood Culture results may not be optimal due to an inadequate volume of blood received in culture bottles   Culture   Final    NO GROWTH 2 DAYS Performed at [facility], [place] [address], [place],  [address]    Report Status PENDING  Incomplete    Radiology Reports CT Angio Chest PE W and/or Wo Contrast  Result Date: 01/27/2020 CLINICAL DATA:  Chills and elevated D-dimer. EXAM: CT ANGIOGRAPHY CHEST WITH CONTRAST TECHNIQUE: Multidetector CT imaging of the chest was performed using the standard  protocol during bolus administration of intravenous contrast. Multiplanar CT image reconstructions and MIPs were obtained to evaluate the vascular anatomy. CONTRAST:  153mL OMNIPAQUE IOHEXOL 300 MG/ML  SOLN COMPARISON:  None. FINDINGS: Cardiovascular: Satisfactory opacification of the pulmonary arteries to the segmental level. No evidence of pulmonary embolism. Normal heart size. No pericardial effusion. Mediastinum/Nodes: No enlarged mediastinal, hilar, or axillary lymph nodes. Thyroid gland, trachea, and esophagus demonstrate no significant findings. Lungs/Pleura: Numerous small multifocal infiltrates are seen scattered throughout both lungs. Very small bilateral pleural effusions are seen. No pneumothorax is identified. Upper Abdomen: No acute abnormality. Musculoskeletal: No chest wall abnormality. Multilevel degenerative changes are seen within the mid to lower thoracic spine. Review of the MIP images confirms the above findings. IMPRESSION: 1. Numerous small multifocal infiltrates scattered throughout both lungs. 2. Very small bilateral pleural effusions. Electronically Signed   By: Virgina Norfolk M.D.   On: 01/27/2020 21:18   DG Chest Port 1 View  Result Date: 01/30/2020 CLINICAL DATA:  Shortness of breath, chest pain. EXAM: PORTABLE CHEST 1 VIEW COMPARISON:  January 27, 2020. FINDINGS: The heart size and mediastinal contours are within normal limits. No pneumothorax or pleural effusion is noted. Mild bibasilar opacities are noted concerning for subsegmental atelectasis or possibly infiltrate. The visualized skeletal structures are unremarkable. IMPRESSION: Mild bibasilar opacities are noted concerning for subsegmental atelectasis or possibly infiltrate. Electronically Signed   By: Marijo Conception M.D.   On: 01/30/2020 12:59   DG Chest Portable 1 View  Result Date: 01/27/2020 CLINICAL DATA:  Chills. Positive COVID test by report. Started feeling unwell 2 days ago. EXAM: PORTABLE CHEST 1 VIEW  COMPARISON:  None. FINDINGS: Normal heart, mediastinum and hila. Clear lungs.  No pleural effusion or pneumothorax. Skeletal structures are grossly intact. IMPRESSION: No active disease. Electronically Signed   By: Lajean Manes M.D.   On: 01/27/2020 15:00

## 2020-02-01 NOTE — Progress Notes (Signed)
CCMD tech, Katharine Look, charted heart rate that caused the patients MEWS score to turn yellow. The patients vitals were rechecked and the patient is stable. No further interventions needed.

## 2020-02-02 LAB — D-DIMER, QUANTITATIVE: D-Dimer, Quant: 0.45 ug/mL-FEU (ref 0.00–0.50)

## 2020-02-02 LAB — CBC WITH DIFFERENTIAL/PLATELET
Abs Immature Granulocytes: 0.02 10*3/uL (ref 0.00–0.07)
Basophils Absolute: 0 10*3/uL (ref 0.0–0.1)
Basophils Relative: 0 %
Eosinophils Absolute: 0 10*3/uL (ref 0.0–0.5)
Eosinophils Relative: 0 %
HCT: 46.5 % (ref 39.0–52.0)
Hemoglobin: 15.2 g/dL (ref 13.0–17.0)
Immature Granulocytes: 0 %
Lymphocytes Relative: 17 %
Lymphs Abs: 1 10*3/uL (ref 0.7–4.0)
MCH: 21.1 pg — ABNORMAL LOW (ref 26.0–34.0)
MCHC: 32.7 g/dL (ref 30.0–36.0)
MCV: 64.6 fL — ABNORMAL LOW (ref 80.0–100.0)
Monocytes Absolute: 0.5 10*3/uL (ref 0.1–1.0)
Monocytes Relative: 9 %
Neutro Abs: 4.2 10*3/uL (ref 1.7–7.7)
Neutrophils Relative %: 74 %
Platelets: 322 10*3/uL (ref 150–400)
RBC: 7.2 MIL/uL — ABNORMAL HIGH (ref 4.22–5.81)
RDW: 18.1 % — ABNORMAL HIGH (ref 11.5–15.5)
WBC: 5.8 10*3/uL (ref 4.0–10.5)
nRBC: 0 % (ref 0.0–0.2)

## 2020-02-02 LAB — COMPREHENSIVE METABOLIC PANEL
ALT: 54 U/L — ABNORMAL HIGH (ref 0–44)
AST: 72 U/L — ABNORMAL HIGH (ref 15–41)
Albumin: 2.8 g/dL — ABNORMAL LOW (ref 3.5–5.0)
Alkaline Phosphatase: 46 U/L (ref 38–126)
Anion gap: 11 (ref 5–15)
BUN: 28 mg/dL — ABNORMAL HIGH (ref 6–20)
CO2: 24 mmol/L (ref 22–32)
Calcium: 8.4 mg/dL — ABNORMAL LOW (ref 8.9–10.3)
Chloride: 99 mmol/L (ref 98–111)
Creatinine, Ser: 1.41 mg/dL — ABNORMAL HIGH (ref 0.61–1.24)
GFR calc Af Amer: >60 mL/min (ref >60)
GFR calc non Af Amer: 57 mL/min — ABNORMAL LOW (ref >60)
Glucose, Bld: 199 mg/dL — ABNORMAL HIGH (ref 70–99)
Potassium: 4.3 mmol/L (ref 3.5–5.1)
Sodium: 134 mmol/L — ABNORMAL LOW (ref 135–145)
Total Bilirubin: 0.7 mg/dL (ref 0.3–1.2)
Total Protein: 6.1 g/dL — ABNORMAL LOW (ref 6.5–8.1)

## 2020-02-02 LAB — FERRITIN: Ferritin: 702 ng/mL — ABNORMAL HIGH (ref 24–336)

## 2020-02-02 LAB — MAGNESIUM: Magnesium: 2.3 mg/dL (ref 1.7–2.4)

## 2020-02-02 LAB — C-REACTIVE PROTEIN: CRP: 3.8 mg/dL — ABNORMAL HIGH (ref <1.0)

## 2020-02-02 MED ORDER — SODIUM CHLORIDE 0.9 % IV SOLN
INTRAVENOUS | Status: DC | PRN
  Administered 2020-02-02: 10 mL via INTRAVENOUS

## 2020-02-02 NOTE — Progress Notes (Signed)
PROGRESS NOTE                                                                                                                                                                                                             Patient Demographics:    Jonathan Casey, is a 49 y.o. male, DOB - Oct 06, 1971, [id number]  Outpatient Primary MD for the patient is Patient, No Pcp Per   Admit date - 01/30/2020   LOS - 3  Chief Complaint  Patient presents with  . Shortness of Breath       Brief Narrative: Patient is a 49 y.o. male without any significant past medical history-who presented with hypoxemia secondary to COVID-19 pneumonia.  See below for further details.  Significant Events: 4/20>> COVID-19 positive 4/23>> CT angio chest-negative for PE, multifocal infiltrates 4/26>> monoclonal antibody infusion 4/26>> admit to [facility] for hypoxia secondary to COVID-19 pneumonia  COVID-19 medications: Steroids: 4/26>> Remdesivir: 4/26  Antibiotics: Rocephin: 4/26>> Zithromax: 4/26>> 4/28  Microbiology data: 4/26: Blood culture>> negative  DVT prophylaxis: SQ Lovenox  Procedures: None  Consults: None    Subjective:   He feels better-he was titrated down to 1 L of oxygen this morning.  Continues to have some coughing spells.   Assessment  & Plan :   Acute Hypoxic Resp Failure due to Covid 19 Viral pneumonia: Slowly improving-oxygen requirements are coming down-CRP is also trending down.  Continue with steroids/remdesivir.remains on empiric Rocephin for concern of concurrent bacterial pneumonia due to a mildly elevated procalcitonin level.  Continued efforts to slowly titrate down FiO2.   We will need to assess for home O2 requirement prior to discharge.  Fever: afebrile  O2 requirements:  SpO2: 94 % O2 Flow Rate (L/min): 1 L/min   COVID-19 Labs: Recent Labs    01/30/20 1317 01/30/20 1317 01/31/20 0520  02/01/20 0615 02/02/20 0349  DDIMER 1.37*   < > 1.00* 0.74* 0.45  FERRITIN 1,082*   < > 1,159* 1,046* 702*  LDH 507*  --   --   --   --   CRP 17.6*   < > 15.7* 7.6* 3.8*   < > = values in this interval not displayed.    No results found for: BNP  Recent Labs  Lab 01/30/20 1317  PROCALCITON 0.39    No results found for: SARSCOV2NAA  Prone/Incentive Spirometry: encouragedincentive spirometry use 3-4/hour.  AKI: Hemodynamically mediated-improving.  Follow periodically.  Transaminitis: Mild-likely with COVID-19-follow periodically.  Hyponatremia: Likely secondary to dehydration-improved  Hypocalcemia: Mild-corrected to albumin-almost normalizes.  Follow for now.  Not sure why patient has low albumin levels-probably secondary to acute illness in the setting of COVID-19 pneumonia.  Also has some amount of mild proteinuria probably due to COVID-19- will need follow-up with PCP and repeat UA once patient has recovered from acute illness.  Chest discomfort: Atypical-troponins/EKG negative.  No further work-up required.  Etiology felt to be musculoskeletal in nature due to persistent coughing spells.  Hypertensive urgency: Blood pressure significantly elevated on initial presentation-currently stable without the use of any antihypertensive medications.  Follow for now.  Obesity: Estimated body mass index is 38.09 kg/m as calculated from the following:   Height as of this encounter: 5\' 9"  (1.753 m).   Weight as of this encounter: 117 kg.  ABG: No results found for: PHART, PCO2ART, PO2ART, HCO3, TCO2, ACIDBASEDEF, O2SAT  Vent Settings: N/A  Condition - Stable  Family Communication  : Patient to update family himself.  I have asked him to let me know if his family has any questions.  Code Status :  Full Code  Diet :  Diet Order            Diet regular Room service appropriate? Yes; Fluid consistency: Thin  Diet effective now               Disposition Plan  :     Remains inpatient appropriate because:Inpatient level of care appropriate due to severity of illness   Dispo: The patient is from: Home              Anticipated d/c is to: Home              Anticipated d/c date is: 2 days              Patient currently is not medically stable to d/c.   Barriers to discharge: Hypoxia requiring O2 supplementation/complete 5 days of IV Remdesivir  Antimicorbials  :    Anti-infectives (From admission, onward)   Start     Dose/Rate Route Frequency Ordered Stop   01/31/20 1000  remdesivir 100 mg in sodium chloride 0.9 % 100 mL IVPB     100 mg 200 mL/hr over 30 Minutes Intravenous Daily 01/30/20 1550 02/04/20 0959   01/30/20 1700  remdesivir 200 mg in sodium chloride 0.9% 250 mL IVPB     200 mg 580 mL/hr over 30 Minutes Intravenous Once 01/30/20 1550 01/30/20 1846   01/30/20 1630  cefTRIAXone (ROCEPHIN) 2 g in sodium chloride 0.9 % 100 mL IVPB     2 g 200 mL/hr over 30 Minutes Intravenous Every 24 hours 01/30/20 1551 02/04/20 1629   01/30/20 1630  azithromycin (ZITHROMAX) tablet 500 mg  Status:  Discontinued     500 mg Oral Daily 01/30/20 1601 02/01/20 1134      Inpatient Medications  Scheduled Meds: . albuterol  2 puff Inhalation Q6H  . vitamin C  500 mg Oral Daily  . dexamethasone (DECADRON) injection  10 mg Intravenous Daily  . enoxaparin (LOVENOX) injection  40 mg Subcutaneous Q24H  . famotidine  20 mg Oral BID  . sodium chloride flush  3 mL Intravenous Q12H  . zinc sulfate  220 mg Oral Daily   Continuous Infusions: . cefTRIAXone (ROCEPHIN)  IV 2 g (02/01/20 1645)  . remdesivir 100 mg in  NS 100 mL 100 mg (02/02/20 1004)   PRN Meds:.acetaminophen, chlorpheniramine-HYDROcodone, guaiFENesin-dextromethorphan, labetalol, ondansetron **OR** ondansetron (ZOFRAN) IV   Time Spent in minutes  25  See all Orders from today for further details   Oren Binet M.D on 02/02/2020 at 11:26 AM  To page go to [email or web address] - use universal  password  [facility] -  Office  [phone]    Objective:   Vitals:   02/01/20 2113 02/01/20 2115 02/02/20 0500 02/02/20 1008  BP: (!) 133/92  140/89 (!) 126/92  Pulse: (!) 102 100 91 93  Resp: (!) 28 (!) 23 (!) 21 20  Temp: 98.2 F (36.8 C)  98.4 F (36.9 C) 97.8 F (36.6 C)  TempSrc: Oral  Oral Oral  SpO2: 91% (!) 88% 93% 94%  Weight:      Height:        Wt Readings from Last 3 Encounters:  02/01/20 117 kg  01/29/20 119.3 kg  01/27/20 119.3 kg     Intake/Output Summary (Last 24 hours) at 02/02/2020 1126 Last data filed at 02/02/2020 0528 Gross per 24 hour  Intake 960 ml  Output 950 ml  Net 10 ml     Physical Exam Gen Exam:Alert awake-not in any distress HEENT:atraumatic, normocephalic Chest: B/L clear to auscultation anteriorly CVS:S1S2 regular Abdomen:soft non tender, non distended Extremities:no edema Neurology: Non focal Skin: no rash   Data Review:    CBC Recent Labs  Lab 01/27/20 1207 01/30/20 1317 01/31/20 0520 02/01/20 0615 02/02/20 0349  WBC 5.3 5.7 4.3 4.1 5.8  HGB 16.7 17.0 15.6 15.5 15.2  HCT 52.4* 51.7 48.4 47.4 46.5  PLT 194 202 214 259 322  MCV 66.3* 66.5* 65.3* 64.3* 64.6*  MCH 21.1* 21.9* 21.1* 21.0* 21.1*  MCHC 31.9 32.9 32.2 32.7 32.7  RDW 19.0* 18.6* 18.3* 18.2* 18.1*  LYMPHSABS 0.9 0.8 0.4* 0.8 1.0  MONOABS 0.6 0.4 0.3 0.4 0.5  EOSABS 0.0 0.1 0.0 0.0 0.0  BASOSABS 0.0 0.0 0.0 0.0 0.0    Chemistries  Recent Labs  Lab 01/27/20 1207 01/30/20 1317 01/31/20 0520 02/01/20 0615 02/02/20 0349  NA 135 130* 131* 135 134*  K 4.2 4.4 4.4 4.5 4.3  CL 97* 95* 96* 102 99  CO2 29 22 21* 23 24  GLUCOSE 163* 116* 158* 193* 199*  BUN 12 10 13 20  28*  CREATININE 1.75* 1.42* 1.29* 1.23 1.41*  CALCIUM 9.0 8.2* 8.1* 8.5* 8.4*  MG  --   --  1.9 2.2 2.3  AST 34 133* 124* 97* 72*  ALT 26 53* 58* 56* 54*  ALKPHOS 69 65 55 49 46  BILITOT 0.7 1.1 0.7 0.6 0.7    ------------------------------------------------------------------------------------------------------------------ Recent Labs    01/30/20 1317  TRIG 69    No results found for: HGBA1C ------------------------------------------------------------------------------------------------------------------ No results for input(s): TSH, T4TOTAL, T3FREE, THYROIDAB in the last 72 hours.  Invalid input(s): FREET3 ------------------------------------------------------------------------------------------------------------------ Recent Labs    02/01/20 0615 02/02/20 0349  FERRITIN 1,046* 702*    Coagulation profile No results for input(s): INR, PROTIME in the last 168 hours.  Recent Labs    02/01/20 0615 02/02/20 0349  DDIMER 0.74* 0.45    Cardiac Enzymes No results for input(s): CKMB, TROPONINI, MYOGLOBIN in the last 168 hours.  Invalid input(s): CK ------------------------------------------------------------------------------------------------------------------ No results found for: BNP  Micro Results Recent Results (from the past 240 hour(s))  Blood Culture (routine x 2)     Status: None (Preliminary result)   Collection Time: 01/30/20  1:17 PM  Specimen: BLOOD  Result Value Ref Range Status   Specimen Description BLOOD RIGHT ANTECUBITAL  Final   Special Requests   Final    BOTTLES DRAWN AEROBIC AND ANAEROBIC Blood Culture adequate volume   Culture   Final    NO GROWTH 3 DAYS Performed at [facility], [address], [place], [place] [address]    Report Status PENDING  Incomplete  Blood Culture (routine x 2)     Status: None (Preliminary result)   Collection Time: 01/30/20  2:29 PM   Specimen: BLOOD LEFT HAND  Result Value Ref Range Status   Specimen Description BLOOD LEFT HAND  Final   Special Requests   Final    BOTTLES DRAWN AEROBIC AND ANAEROBIC Blood Culture results may not be optimal due to an inadequate volume of blood received in culture bottles    Culture   Final    NO GROWTH 3 DAYS Performed at [facility], [place] [address] [place], [place] [address]    Report Status PENDING  Incomplete    Radiology Reports CT Angio Chest PE W and/or Wo Contrast  Result Date: 01/27/2020 CLINICAL DATA:  Chills and elevated D-dimer. EXAM: CT ANGIOGRAPHY CHEST WITH CONTRAST TECHNIQUE: Multidetector CT imaging of the chest was performed using the standard protocol during bolus administration of intravenous contrast. Multiplanar CT image reconstructions and MIPs were obtained to evaluate the vascular anatomy. CONTRAST:  149mL OMNIPAQUE IOHEXOL 300 MG/ML  SOLN COMPARISON:  None. FINDINGS: Cardiovascular: Satisfactory opacification of the pulmonary arteries to the segmental level. No evidence of pulmonary embolism. Normal heart size. No pericardial effusion. Mediastinum/Nodes: No enlarged mediastinal, hilar, or axillary lymph nodes. Thyroid gland, trachea, and esophagus demonstrate no significant findings. Lungs/Pleura: Numerous small multifocal infiltrates are seen scattered throughout both lungs. Very small bilateral pleural effusions are seen. No pneumothorax is identified. Upper Abdomen: No acute abnormality. Musculoskeletal: No chest wall abnormality. Multilevel degenerative changes are seen within the mid to lower thoracic spine. Review of the MIP images confirms the above findings. IMPRESSION: 1. Numerous small multifocal infiltrates scattered throughout both lungs. 2. Very small bilateral pleural effusions. Electronically Signed   By: Virgina Norfolk M.D.   On: 01/27/2020 21:18   DG Chest Port 1 View  Result Date: 01/30/2020 CLINICAL DATA:  Shortness of breath, chest pain. EXAM: PORTABLE CHEST 1 VIEW COMPARISON:  January 27, 2020. FINDINGS: The heart size and mediastinal contours are within normal limits. No pneumothorax or pleural effusion is noted. Mild bibasilar opacities are noted concerning for subsegmental atelectasis or possibly infiltrate. The  visualized skeletal structures are unremarkable. IMPRESSION: Mild bibasilar opacities are noted concerning for subsegmental atelectasis or possibly infiltrate. Electronically Signed   By: Marijo Conception M.D.   On: 01/30/2020 12:59   DG Chest Portable 1 View  Result Date: 01/27/2020 CLINICAL DATA:  Chills. Positive COVID test by report. Started feeling unwell 2 days ago. EXAM: PORTABLE CHEST 1 VIEW COMPARISON:  None. FINDINGS: Normal heart, mediastinum and hila. Clear lungs.  No pleural effusion or pneumothorax. Skeletal structures are grossly intact. IMPRESSION: No active disease. Electronically Signed   By: Lajean Manes M.D.   On: 01/27/2020 15:00

## 2020-02-02 NOTE — Progress Notes (Signed)
SATURATION QUALIFICATIONS: (This note is used to comply with regulatory documentation for home oxygen)  Patient Saturations on Room Air at Rest = 95%  Patient Saturations on Room Air while Ambulating = 86%  Patient Saturations on 2 Liters of oxygen while Ambulating = 92%  Please briefly explain why patient needs home oxygen:  Pts work of breathing increased with ambulation, O2 saturation decreased to 86%.

## 2020-02-03 LAB — CBC WITH DIFFERENTIAL/PLATELET
Abs Immature Granulocytes: 0 10*3/uL (ref 0.00–0.07)
Basophils Absolute: 0 10*3/uL (ref 0.0–0.1)
Basophils Relative: 0 %
Eosinophils Absolute: 0 10*3/uL (ref 0.0–0.5)
Eosinophils Relative: 0 %
HCT: 44 % (ref 39.0–52.0)
Hemoglobin: 14.3 g/dL (ref 13.0–17.0)
Lymphocytes Relative: 12 %
Lymphs Abs: 0.7 10*3/uL (ref 0.7–4.0)
MCH: 21 pg — ABNORMAL LOW (ref 26.0–34.0)
MCHC: 32.5 g/dL (ref 30.0–36.0)
MCV: 64.7 fL — ABNORMAL LOW (ref 80.0–100.0)
Monocytes Absolute: 0.5 10*3/uL (ref 0.1–1.0)
Monocytes Relative: 9 %
Neutro Abs: 4.7 10*3/uL (ref 1.7–7.7)
Neutrophils Relative %: 79 %
Platelets: 275 10*3/uL (ref 150–400)
RBC: 6.8 MIL/uL — ABNORMAL HIGH (ref 4.22–5.81)
RDW: 17.4 % — ABNORMAL HIGH (ref 11.5–15.5)
WBC: 6 10*3/uL (ref 4.0–10.5)
nRBC: 0 % (ref 0.0–0.2)
nRBC: 0 /100 WBC

## 2020-02-03 LAB — COMPREHENSIVE METABOLIC PANEL
ALT: 55 U/L — ABNORMAL HIGH (ref 0–44)
AST: 62 U/L — ABNORMAL HIGH (ref 15–41)
Albumin: 2.7 g/dL — ABNORMAL LOW (ref 3.5–5.0)
Alkaline Phosphatase: 40 U/L (ref 38–126)
Anion gap: 10 (ref 5–15)
BUN: 23 mg/dL — ABNORMAL HIGH (ref 6–20)
CO2: 24 mmol/L (ref 22–32)
Calcium: 8.2 mg/dL — ABNORMAL LOW (ref 8.9–10.3)
Chloride: 100 mmol/L (ref 98–111)
Creatinine, Ser: 1.26 mg/dL — ABNORMAL HIGH (ref 0.61–1.24)
GFR calc Af Amer: >60 mL/min (ref >60)
GFR calc non Af Amer: >60 mL/min (ref >60)
Glucose, Bld: 167 mg/dL — ABNORMAL HIGH (ref 70–99)
Potassium: 4.2 mmol/L (ref 3.5–5.1)
Sodium: 134 mmol/L — ABNORMAL LOW (ref 135–145)
Total Bilirubin: 0.6 mg/dL (ref 0.3–1.2)
Total Protein: 5.7 g/dL — ABNORMAL LOW (ref 6.5–8.1)

## 2020-02-03 LAB — D-DIMER, QUANTITATIVE: D-Dimer, Quant: 0.53 ug/mL-FEU — ABNORMAL HIGH (ref 0.00–0.50)

## 2020-02-03 LAB — MAGNESIUM: Magnesium: 2.2 mg/dL (ref 1.7–2.4)

## 2020-02-03 LAB — FERRITIN: Ferritin: 569 ng/mL — ABNORMAL HIGH (ref 24–336)

## 2020-02-03 LAB — C-REACTIVE PROTEIN: CRP: 1.8 mg/dL — ABNORMAL HIGH (ref <1.0)

## 2020-02-03 MED ORDER — FUROSEMIDE 10 MG/ML IJ SOLN
40.0000 mg | Freq: Once | INTRAMUSCULAR | Status: AC
  Administered 2020-02-03: 09:00:00 40 mg via INTRAVENOUS
  Filled 2020-02-03: qty 4

## 2020-02-03 MED ORDER — BENZONATATE 100 MG PO CAPS
200.0000 mg | ORAL_CAPSULE | Freq: Three times a day (TID) | ORAL | Status: DC
  Administered 2020-02-03 – 2020-02-04 (×4): 200 mg via ORAL
  Filled 2020-02-03 (×4): qty 2

## 2020-02-03 NOTE — Progress Notes (Signed)
PROGRESS NOTE                                                                                                                                                                                                             Patient Demographics:    Jonathan Casey, is a 49 y.o. male, DOB - October 21, 1970, [id number]  Outpatient Primary MD for the patient is Patient, No Pcp Per   Admit date - 01/30/2020   LOS - 4  Chief Complaint  Patient presents with  . Shortness of Breath       Brief Narrative: Patient is a 49 y.o. male without any significant past medical history-who presented with hypoxemia secondary to COVID-19 pneumonia.  See below for further details.  Significant Events: 4/20>> COVID-19 positive 4/23>> CT angio chest-negative for PE, multifocal infiltrates 4/26>> monoclonal antibody infusion 4/26>> admit to [facility] for hypoxia secondary to COVID-19 pneumonia  COVID-19 medications: Steroids: 4/26>> Remdesivir: 4/26  Antibiotics: Rocephin: 4/26>> Zithromax: 4/26>> 4/28  Microbiology data: 4/26: Blood culture>> negative  DVT prophylaxis: SQ Lovenox  Procedures: None  Consults: None    Subjective:   Doing well-still gets short of breath with activity-down to 1 L of oxygen this morning.   Assessment  & Plan :   Acute Hypoxic Resp Failure due to Covid 19 Viral pneumonia: Slowly improving-CRP trending down-continue steroids/remdesivir/Rocephin.  Oxygen desaturation screen with ambulation completed-we will likely require home O2 on discharge-have ordered.    We will need to assess for home O2 requirement prior to discharge.  Fever: afebrile  O2 requirements:  SpO2: 93 % O2 Flow Rate (L/min): 2 L/min   COVID-19 Labs: Recent Labs    02/01/20 0615 02/02/20 0349 02/03/20 0454  DDIMER 0.74* 0.45 0.53*  FERRITIN 1,046* 702* 569*  CRP 7.6* 3.8* 1.8*    No results found for: BNP  Recent Labs  Lab  01/30/20 1317  PROCALCITON 0.39    No results found for: SARSCOV2NAA   Prone/Incentive Spirometry: encouragedincentive spirometry use 3-4/hour.  AKI: Hemodynamically mediated-improving.  Follow periodically.  Transaminitis: Mild-likely with COVID-19-follow periodically.  Hyponatremia: Likely secondary to dehydration-improved  Hypocalcemia: Mild-corrected to albumin-almost normalizes.  Follow for now.  Not sure why patient has low albumin levels-probably secondary to acute illness in the setting of COVID-19 pneumonia.  Also has some amount of mild proteinuria probably due to COVID-19-  will need follow-up with PCP and repeat UA once patient has recovered from acute illness.  Chest discomfort: Atypical-troponins/EKG negative.  No further work-up required.  Etiology felt to be musculoskeletal in nature due to persistent coughing spells.  Hypertensive urgency: Blood pressure significantly elevated on initial presentation-currently stable without the use of any antihypertensive medications.  Follow for now.  Obesity: Estimated body mass index is 38.09 kg/m as calculated from the following:   Height as of this encounter: 5\' 9"  (1.753 m).   Weight as of this encounter: 117 kg.  ABG: No results found for: PHART, PCO2ART, PO2ART, HCO3, TCO2, ACIDBASEDEF, O2SAT  Vent Settings: N/A  Condition - Stable  Family Communication  : Patient to update family himself.  I have asked him to let me know if his family has any questions.  Code Status :  Full Code  Diet :  Diet Order            Diet regular Room service appropriate? Yes; Fluid consistency: Thin  Diet effective now               Disposition Plan  :    Remains inpatient appropriate because:Inpatient level of care appropriate due to severity of illness   Dispo: The patient is from: Home              Anticipated d/c is to: Home              Anticipated d/c date is: 2 days              Patient currently is not medically stable  to d/c.   Barriers to discharge: Hypoxia requiring O2 supplementation/complete 5 days of IV Remdesivir  Antimicorbials  :    Anti-infectives (From admission, onward)   Start     Dose/Rate Route Frequency Ordered Stop   01/31/20 1000  remdesivir 100 mg in sodium chloride 0.9 % 100 mL IVPB     100 mg 200 mL/hr over 30 Minutes Intravenous Daily 01/30/20 1550 02/03/20 0959   01/30/20 1700  remdesivir 200 mg in sodium chloride 0.9% 250 mL IVPB     200 mg 580 mL/hr over 30 Minutes Intravenous Once 01/30/20 1550 01/30/20 1846   01/30/20 1630  cefTRIAXone (ROCEPHIN) 2 g in sodium chloride 0.9 % 100 mL IVPB     2 g 200 mL/hr over 30 Minutes Intravenous Every 24 hours 01/30/20 1551 02/04/20 1629   01/30/20 1630  azithromycin (ZITHROMAX) tablet 500 mg  Status:  Discontinued     500 mg Oral Daily 01/30/20 1601 02/01/20 1134      Inpatient Medications  Scheduled Meds: . albuterol  2 puff Inhalation Q6H  . vitamin C  500 mg Oral Daily  . benzonatate  200 mg Oral TID  . dexamethasone (DECADRON) injection  10 mg Intravenous Daily  . enoxaparin (LOVENOX) injection  40 mg Subcutaneous Q24H  . famotidine  20 mg Oral BID  . sodium chloride flush  3 mL Intravenous Q12H  . zinc sulfate  220 mg Oral Daily   Continuous Infusions: . sodium chloride 10 mL (02/02/20 2215)  . cefTRIAXone (ROCEPHIN)  IV 2 g (02/02/20 2216)   PRN Meds:.sodium chloride, acetaminophen, chlorpheniramine-HYDROcodone, guaiFENesin-dextromethorphan, labetalol, ondansetron **OR** ondansetron (ZOFRAN) IV   Time Spent in minutes  25  See all Orders from today for further details   Oren Binet M.D on 02/03/2020 at 12:07 PM  To page go to [email or web address] - use universal password  [facility] -  Office  [phone]    Objective:   Vitals:   02/02/20 0500 02/02/20 1008 02/02/20 2201 02/03/20 0422  BP: 140/89 (!) 126/92 121/70 125/81  Pulse: 91 93 95 93  Resp: (!) 21 20 (!) 24 18  Temp: 98.4 F (36.9 C) 97.8  F (36.6 C) 97.9 F (36.6 C) 97.8 F (36.6 C)  TempSrc: Oral Oral Oral Oral  SpO2: 93% 94% 94% 93%  Weight:      Height:        Wt Readings from Last 3 Encounters:  02/01/20 117 kg  01/29/20 119.3 kg  01/27/20 119.3 kg     Intake/Output Summary (Last 24 hours) at 02/03/2020 1207 Last data filed at 02/03/2020 1000 Gross per 24 hour  Intake 772.76 ml  Output 1400 ml  Net -627.24 ml     Physical Exam Gen Exam:Alert awake-not in any distress HEENT:atraumatic, normocephalic Chest: B/L clear to auscultation anteriorly CVS:S1S2 regular Abdomen:soft non tender, non distended Extremities:no edema Neurology: Non focal Skin: no rash   Data Review:    CBC Recent Labs  Lab 01/30/20 1317 01/31/20 0520 02/01/20 0615 02/02/20 0349 02/03/20 0454  WBC 5.7 4.3 4.1 5.8 6.0  HGB 17.0 15.6 15.5 15.2 14.3  HCT 51.7 48.4 47.4 46.5 44.0  PLT 202 214 259 322 275  MCV 66.5* 65.3* 64.3* 64.6* 64.7*  MCH 21.9* 21.1* 21.0* 21.1* 21.0*  MCHC 32.9 32.2 32.7 32.7 32.5  RDW 18.6* 18.3* 18.2* 18.1* 17.4*  LYMPHSABS 0.8 0.4* 0.8 1.0 0.7  MONOABS 0.4 0.3 0.4 0.5 0.5  EOSABS 0.1 0.0 0.0 0.0 0.0  BASOSABS 0.0 0.0 0.0 0.0 0.0    Chemistries  Recent Labs  Lab 01/30/20 1317 01/31/20 0520 02/01/20 0615 02/02/20 0349 02/03/20 0454  NA 130* 131* 135 134* 134*  K 4.4 4.4 4.5 4.3 4.2  CL 95* 96* 102 99 100  CO2 22 21* 23 24 24   GLUCOSE 116* 158* 193* 199* 167*  BUN 10 13 20  28* 23*  CREATININE 1.42* 1.29* 1.23 1.41* 1.26*  CALCIUM 8.2* 8.1* 8.5* 8.4* 8.2*  MG  --  1.9 2.2 2.3 2.2  AST 133* 124* 97* 72* 62*  ALT 53* 58* 56* 54* 55*  ALKPHOS 65 55 49 46 40  BILITOT 1.1 0.7 0.6 0.7 0.6   ------------------------------------------------------------------------------------------------------------------ No results for input(s): CHOL, HDL, LDLCALC, TRIG, CHOLHDL, LDLDIRECT in the last 72 hours.  No results found for:  HGBA1C ------------------------------------------------------------------------------------------------------------------ No results for input(s): TSH, T4TOTAL, T3FREE, THYROIDAB in the last 72 hours.  Invalid input(s): FREET3 ------------------------------------------------------------------------------------------------------------------ Recent Labs    02/02/20 0349 02/03/20 0454  FERRITIN 702* 569*    Coagulation profile No results for input(s): INR, PROTIME in the last 168 hours.  Recent Labs    02/02/20 0349 02/03/20 0454  DDIMER 0.45 0.53*    Cardiac Enzymes No results for input(s): CKMB, TROPONINI, MYOGLOBIN in the last 168 hours.  Invalid input(s): CK ------------------------------------------------------------------------------------------------------------------ No results found for: BNP  Micro Results Recent Results (from the past 240 hour(s))  Blood Culture (routine x 2)     Status: None (Preliminary result)   Collection Time: 01/30/20  1:17 PM   Specimen: BLOOD  Result Value Ref Range Status   Specimen Description BLOOD RIGHT ANTECUBITAL  Final   Special Requests   Final    BOTTLES DRAWN AEROBIC AND ANAEROBIC Blood Culture adequate volume   Culture   Final    NO GROWTH 3 DAYS Performed at [facility], [address],  [place] [address]    Report Status PENDING  Incomplete  Blood Culture (routine x 2)     Status: None (Preliminary result)   Collection Time: 01/30/20  2:29 PM   Specimen: BLOOD LEFT HAND  Result Value Ref Range Status   Specimen Description BLOOD LEFT HAND  Final   Special Requests   Final    BOTTLES DRAWN AEROBIC AND ANAEROBIC Blood Culture results may not be optimal due to an inadequate volume of blood received in culture bottles   Culture   Final    NO GROWTH 3 DAYS Performed at [facility], [place] [address], [place], [place] [address]    Report Status PENDING  Incomplete    Radiology Reports CT Angio Chest  PE W and/or Wo Contrast  Result Date: 01/27/2020 CLINICAL DATA:  Chills and elevated D-dimer. EXAM: CT ANGIOGRAPHY CHEST WITH CONTRAST TECHNIQUE: Multidetector CT imaging of the chest was performed using the standard protocol during bolus administration of intravenous contrast. Multiplanar CT image reconstructions and MIPs were obtained to evaluate the vascular anatomy. CONTRAST:  137mL OMNIPAQUE IOHEXOL 300 MG/ML  SOLN COMPARISON:  None. FINDINGS: Cardiovascular: Satisfactory opacification of the pulmonary arteries to the segmental level. No evidence of pulmonary embolism. Normal heart size. No pericardial effusion. Mediastinum/Nodes: No enlarged mediastinal, hilar, or axillary lymph nodes. Thyroid gland, trachea, and esophagus demonstrate no significant findings. Lungs/Pleura: Numerous small multifocal infiltrates are seen scattered throughout both lungs. Very small bilateral pleural effusions are seen. No pneumothorax is identified. Upper Abdomen: No acute abnormality. Musculoskeletal: No chest wall abnormality. Multilevel degenerative changes are seen within the mid to lower thoracic spine. Review of the MIP images confirms the above findings. IMPRESSION: 1. Numerous small multifocal infiltrates scattered throughout both lungs. 2. Very small bilateral pleural effusions. Electronically Signed   By: Virgina Norfolk M.D.   On: 01/27/2020 21:18   DG Chest Port 1 View  Result Date: 01/30/2020 CLINICAL DATA:  Shortness of breath, chest pain. EXAM: PORTABLE CHEST 1 VIEW COMPARISON:  January 27, 2020. FINDINGS: The heart size and mediastinal contours are within normal limits. No pneumothorax or pleural effusion is noted. Mild bibasilar opacities are noted concerning for subsegmental atelectasis or possibly infiltrate. The visualized skeletal structures are unremarkable. IMPRESSION: Mild bibasilar opacities are noted concerning for subsegmental atelectasis or possibly infiltrate. Electronically Signed   By: Marijo Conception M.D.   On: 01/30/2020 12:59   DG Chest Portable 1 View  Result Date: 01/27/2020 CLINICAL DATA:  Chills. Positive COVID test by report. Started feeling unwell 2 days ago. EXAM: PORTABLE CHEST 1 VIEW COMPARISON:  None. FINDINGS: Normal heart, mediastinum and hila. Clear lungs.  No pleural effusion or pneumothorax. Skeletal structures are grossly intact. IMPRESSION: No active disease. Electronically Signed   By: Lajean Manes M.D.   On: 01/27/2020 15:00

## 2020-02-03 NOTE — TOC Initial Note (Addendum)
Transition of Care (TOC) - Initial/Assessment Note  Marvetta Gibbons RN, BSN Transitions of Care Unit 4E- RN Case Manager [phone] Cross Coverage for 5W   Patient Details  Name: Jonathan Casey. MRN: [id number] Date of Birth: 08-02-1971  Transition [facility]) CM/SW Contact:    Dawayne Patricia, RN Phone Number: 02/03/2020, 1:13 PM  Clinical Narrative:                 Pt. Admitted from home +COVID, noted order for home 02, pt uninsured and referral made to [employer] for charity home 02 needs. [facility] will f/u on home 02 needs and review qualifying criteria.  TOC to follow for transition needs- pt may need MATCH for medications, has appointment for post COVID care center for f/u.  Follow up with [employer] for home 02 arrangements Pt will need updated qualifying note for home 02- within 48hr of discharge   Expected Discharge Plan: Home/Self Care Barriers to Discharge: Continued Medical Work up   Patient Goals and CMS Choice        Expected Discharge Plan and Services Expected Discharge Plan: Home/Self Care   Discharge Planning Services: CM Consult Post Acute Care Choice: Durable Medical Equipment Living arrangements for the past 2 months: Single Family Home                 DME Arranged: Oxygen DME Agency: [employer] Date DME Agency Contacted: 02/03/20 Time DME Agency Contacted: 22 Representative spoke with at DME Agency: Thedore Mins            Prior Living Arrangements/Services Living arrangements for the past 2 months: [place] Lives with:: Self, Friends Patient language and need for interpreter reviewed:: Yes        Need for Family Participation in Patient Care: Yes (Comment) Care giver support system in place?: Yes (comment)   Criminal Activity/Legal Involvement Pertinent to Current Situation/Hospitalization: No - Comment as needed  Activities of Daily Living Home Assistive Devices/Equipment: None ADL Screening (condition at time of  admission) Patient's cognitive ability adequate to safely complete daily activities?: Yes Is the patient deaf or have difficulty hearing?: No Does the patient have difficulty seeing, even when wearing glasses/contacts?: No Does the patient have difficulty concentrating, remembering, or making decisions?: No Patient able to express need for assistance with ADLs?: No Does the patient have difficulty dressing or bathing?: No Independently performs ADLs?: Yes (appropriate for developmental age) Does the patient have difficulty walking or climbing stairs?: No Weakness of Legs: None Weakness of Arms/Hands: None  Permission Sought/Granted                  Emotional Assessment              Admission diagnosis:  Acute respiratory failure with hypoxia ([place]) [J96.01] COVID-19 virus infection [U07.1] Pneumonia due to COVID-19 virus [U07.1, J12.82] Patient Active Problem List   Diagnosis Date Noted  . AKI (acute kidney injury) ([place]) 01/31/2020  . Hypertensive urgency 01/31/2020  . Chest discomfort 01/31/2020  . Hyponatremia 01/31/2020  . Acute respiratory failure with hypoxia ([place]) 01/31/2020  . Obesity (BMI 30-39.9) 01/31/2020  . Pneumonia due to COVID-19 virus 01/30/2020   PCP:  Patient, No Pcp Per Pharmacy:   [id number] - [place], [place] [address] Phone: [phone] Fax: [phone]     Social Determinants of Health (SDOH) Interventions    Readmission Risk Interventions No flowsheet data found.

## 2020-02-04 LAB — COMPREHENSIVE METABOLIC PANEL
ALT: 59 U/L — ABNORMAL HIGH (ref 0–44)
AST: 48 U/L — ABNORMAL HIGH (ref 15–41)
Albumin: 2.9 g/dL — ABNORMAL LOW (ref 3.5–5.0)
Alkaline Phosphatase: 46 U/L (ref 38–126)
Anion gap: 9 (ref 5–15)
BUN: 23 mg/dL — ABNORMAL HIGH (ref 6–20)
CO2: 25 mmol/L (ref 22–32)
Calcium: 8.6 mg/dL — ABNORMAL LOW (ref 8.9–10.3)
Chloride: 101 mmol/L (ref 98–111)
Creatinine, Ser: 1.21 mg/dL (ref 0.61–1.24)
GFR calc Af Amer: >60 mL/min (ref >60)
GFR calc non Af Amer: >60 mL/min (ref >60)
Glucose, Bld: 180 mg/dL — ABNORMAL HIGH (ref 70–99)
Potassium: 4.1 mmol/L (ref 3.5–5.1)
Sodium: 135 mmol/L (ref 135–145)
Total Bilirubin: 0.7 mg/dL (ref 0.3–1.2)
Total Protein: 6.2 g/dL — ABNORMAL LOW (ref 6.5–8.1)

## 2020-02-04 LAB — D-DIMER, QUANTITATIVE: D-Dimer, Quant: 0.59 ug/mL-FEU — ABNORMAL HIGH (ref 0.00–0.50)

## 2020-02-04 LAB — CULTURE, BLOOD (ROUTINE X 2)
Culture: NO GROWTH
Culture: NO GROWTH
Special Requests: ADEQUATE

## 2020-02-04 LAB — CBC WITH DIFFERENTIAL/PLATELET
Abs Immature Granulocytes: 0.04 10*3/uL (ref 0.00–0.07)
Basophils Absolute: 0 10*3/uL (ref 0.0–0.1)
Basophils Relative: 0 %
Eosinophils Absolute: 0 10*3/uL (ref 0.0–0.5)
Eosinophils Relative: 0 %
HCT: 46.4 % (ref 39.0–52.0)
Hemoglobin: 14.8 g/dL (ref 13.0–17.0)
Immature Granulocytes: 1 %
Lymphocytes Relative: 21 %
Lymphs Abs: 1.2 10*3/uL (ref 0.7–4.0)
MCH: 20.5 pg — ABNORMAL LOW (ref 26.0–34.0)
MCHC: 31.9 g/dL (ref 30.0–36.0)
MCV: 64.4 fL — ABNORMAL LOW (ref 80.0–100.0)
Monocytes Absolute: 0.8 10*3/uL (ref 0.1–1.0)
Monocytes Relative: 13 %
Neutro Abs: 3.9 10*3/uL (ref 1.7–7.7)
Neutrophils Relative %: 65 %
Platelets: 380 10*3/uL (ref 150–400)
RBC: 7.21 MIL/uL — ABNORMAL HIGH (ref 4.22–5.81)
RDW: 17.8 % — ABNORMAL HIGH (ref 11.5–15.5)
WBC: 5.9 10*3/uL (ref 4.0–10.5)
nRBC: 0 % (ref 0.0–0.2)

## 2020-02-04 LAB — MAGNESIUM: Magnesium: 2.3 mg/dL (ref 1.7–2.4)

## 2020-02-04 LAB — FERRITIN: Ferritin: 560 ng/mL — ABNORMAL HIGH (ref 24–336)

## 2020-02-04 LAB — C-REACTIVE PROTEIN: CRP: 2.2 mg/dL — ABNORMAL HIGH (ref <1.0)

## 2020-02-04 MED ORDER — ALBUTEROL SULFATE HFA 108 (90 BASE) MCG/ACT IN AERS
2.0000 | INHALATION_SPRAY | Freq: Four times a day (QID) | RESPIRATORY_TRACT | 0 refills | Status: DC | PRN
Start: 2020-02-04 — End: 2021-06-11

## 2020-02-04 MED ORDER — BENZONATATE 100 MG PO CAPS: 100.0000 mg | ORAL_CAPSULE | Freq: Three times a day (TID) | ORAL | 0 refills | Status: AC | PRN

## 2020-02-04 MED ORDER — DEXAMETHASONE 6 MG PO TABS
6.0000 mg | ORAL_TABLET | Freq: Every day | ORAL | 0 refills | Status: DC
Start: 2020-02-04 — End: 2021-06-11

## 2020-02-04 NOTE — Discharge Summary (Signed)
PATIENT DETAILS Name: Jonathan Casey. Age: 49 y.o. Sex: male Date of Birth: June 19, 1971 MRN: [id number]. Admitting Physician: Norval Morton, MD QP:[id number], No Pcp Per  Admit Date: 01/30/2020 Discharge date: 02/04/2020  Recommendations for Outpatient Follow-up:  1. Follow up with PCP in 1-2 weeks 2. Please obtain CMP/CBC in one week 3. Once patient has recovered from acute illness-please see if proteinuria and mild hypocalcemia have resolved.  Also had low albumin levels that should improve once patient has improved from acute illness.  If these electrolyte abnormalities are still present-he may need further work-up. 4. Repeat Chest Xray in 4-6 week 5. Please assess whether patient still requires oxygen at next visit.  Admitted From:  Home  Disposition: [place]: Yes  Equipment/Devices: Oxygen 2 L-with ambulation.  Discharge Condition: Stable  CODE STATUS: FULL CODE  Diet recommendation:  Diet Order            Diet general        Diet regular Room service appropriate? Yes; Fluid consistency: Thin  Diet effective now               Brief Narrative: Patient is a 49 y.o. male without any significant past medical history-who presented with hypoxemia secondary to COVID-19 pneumonia.  See below for further details.  Significant Events: 4/20>> COVID-19 positive 4/23>> CT angio chest-negative for PE, multifocal infiltrates 4/26>> monoclonal antibody infusion 4/26>> admit to [facility] for hypoxia secondary to COVID-19 pneumonia  COVID-19 medications: Steroids: 4/26>> Remdesivir: 4/26  Antibiotics: Rocephin: 4/26>> Zithromax: 4/26>> 4/28  Microbiology data: 4/26: Blood culture>> negative  Procedures: None  Consults: None  Brief Hospital Course: Acute Hypoxic Resp Failure due to Covid 19 Viral pneumonia:  Much better-has been titrated down to room air-but still requires O2 with ambulation.  He does get only minimally short of breath when he  ambulates the hallway-but feels significantly better than how he first presented to the hospital.  Has completed a course of remdesivir.  Due to minimally elevated procalcitonin level-he was also placed on empiric antimicrobial therapy that he has completed as well.  He will continue on tapering steroids on discharge.  Patient was asked to follow-up with his primary care practitioner in the next few weeks-he will need a two-view chest x-ray-and need reassessment whether he still requires oxygen or not.   COVID-19 Labs:  Recent Labs    02/02/20 0349 02/03/20 0454 02/04/20 0517  DDIMER 0.45 0.53* 0.59*  FERRITIN 702* 569* 560*  CRP 3.8* 1.8* 2.2*    No results found for: SARSCOV2NAA   AKI: Hemodynamically mediated-improving.  Follow periodically.  Transaminitis: Mild-likely with COVID-19-follow periodically.  Hyponatremia: Likely secondary to dehydration-improved  Hypocalcemia: Mild-corrected to albumin-almost normalizes.  Follow for now.  Not sure why patient has low albumin levels-probably secondary to acute illness in the setting of COVID-19 pneumonia.  Also has some amount of mild proteinuria probably due to COVID-19- will need follow-up with PCP and repeat UA once patient has recovered from acute illness.  Chest discomfort: Atypical-troponins/EKG negative.  No further work-up required.  Etiology felt to be musculoskeletal in nature due to persistent coughing spells.  Hypertensive urgency: Blood pressure significantly elevated on initial presentation-currently stable without the use of any antihypertensive medications.  Follow for now.   Morbid Obesity: Estimated body mass index is 38.09 kg/m as calculated from the following:   Height as of this encounter: 5\' 9"  (1.753 m).   Weight as of this encounter: 117 kg.  Discharge Diagnoses:  Principal Problem:   Acute respiratory failure with hypoxia (HCC) Active Problems:   Pneumonia due to COVID-19 virus   AKI (acute  kidney injury) ([place])   Hypertensive urgency   Chest discomfort   Hyponatremia   Obesity (BMI 30-39.9)   Discharge Instructions:    Person Under Monitoring Name: Jonathan Casey.  Location: [address] [place] [place] [address]   Infection Prevention Recommendations for Individuals Confirmed to have, or Being Evaluated for, 2019 Novel Coronavirus (COVID-19) Infection Who Receive Care at Home  Individuals who are confirmed to have, or are being evaluated for, COVID-19 should follow the prevention steps below until a healthcare provider or local or state health department says they can return to normal activities.  Stay home except to get medical care You should restrict activities outside your home, except for getting medical care. Do not go to work, school, or public areas, and do not use public transportation or taxis.  Call ahead before visiting your doctor Before your medical appointment, call the healthcare provider and tell them that you have, or are being evaluated for, COVID-19 infection. This will help the healthcare provider's office take steps to keep other people from getting infected. Ask your healthcare provider to call the local or state health department.  Monitor your symptoms Seek prompt medical attention if your illness is worsening (e.g., difficulty breathing). Before going to your medical appointment, call the healthcare provider and tell them that you have, or are being evaluated for, COVID-19 infection. Ask your healthcare provider to call the local or state health department.  Wear a facemask You should wear a facemask that covers your nose and mouth when you are in the same room with other people and when you visit a healthcare provider. People who live with or visit you should also wear a facemask while they are in the same room with you.  Separate yourself from other people in your home As much as possible, you should stay in a different room  from other people in your home. Also, you should use a separate bathroom, if available.  Avoid sharing household items You should not share dishes, drinking glasses, cups, eating utensils, towels, bedding, or other items with other people in your home. After using these items, you should wash them thoroughly with soap and water.  Cover your coughs and sneezes Cover your mouth and nose with a tissue when you cough or sneeze, or you can cough or sneeze into your sleeve. Throw used tissues in a lined trash can, and immediately wash your hands with soap and water for at least 20 seconds or use an alcohol-based hand rub.  Wash your [facility] your hands often and thoroughly with soap and water for at least 20 seconds. You can use an alcohol-based hand sanitizer if soap and water are not available and if your hands are not visibly dirty. Avoid touching your eyes, nose, and mouth with unwashed hands.   Prevention Steps for Caregivers and Household Members of Individuals Confirmed to have, or Being Evaluated for, COVID-19 Infection Being Cared for in the Home  If you live with, or provide care at home for, a person confirmed to have, or being evaluated for, COVID-19 infection please follow these guidelines to prevent infection:  Follow healthcare provider's instructions Make sure that you understand and can help the patient follow any healthcare provider instructions for all care.  Provide for the patient's basic needs You should help the patient with basic  needs in the home and provide support for getting groceries, prescriptions, and other personal needs.  Monitor the patient's symptoms If they are getting sicker, call his or her medical provider and tell them that the patient has, or is being evaluated for, COVID-19 infection. This will help the healthcare provider's office take steps to keep other people from getting infected. Ask the healthcare provider to call the local or state  health department.  Limit the number of people who have contact with the patient  If possible, have only one caregiver for the patient.  Other household members should stay in another home or place of residence. If this is not possible, they should stay  in another room, or be separated from the patient as much as possible. Use a separate bathroom, if available.  Restrict visitors who do not have an essential need to be in the home.  Keep older adults, very young children, and other sick people away from the patient Keep older adults, very young children, and those who have compromised immune systems or chronic health conditions away from the patient. This includes people with chronic heart, lung, or kidney conditions, diabetes, and cancer.  Ensure good ventilation Make sure that shared spaces in the home have good air flow, such as from an air conditioner or an opened window, weather permitting.  Wash your hands often  Wash your hands often and thoroughly with soap and water for at least 20 seconds. You can use an alcohol based hand sanitizer if soap and water are not available and if your hands are not visibly dirty.  Avoid touching your eyes, nose, and mouth with unwashed hands.  Use disposable paper towels to dry your hands. If not available, use dedicated cloth towels and replace them when they become wet.  Wear a facemask and gloves  Wear a disposable facemask at all times in the room and gloves when you touch or have contact with the patient's blood, body fluids, and/or secretions or excretions, such as sweat, saliva, sputum, nasal mucus, vomit, urine, or feces.  Ensure the mask fits over your nose and mouth tightly, and do not touch it during use.  Throw out disposable facemasks and gloves after using them. Do not reuse.  Wash your hands immediately after removing your facemask and gloves.  If your personal clothing becomes contaminated, carefully remove clothing and  launder. Wash your hands after handling contaminated clothing.  Place all used disposable facemasks, gloves, and other waste in a lined container before disposing them with other household waste.  Remove gloves and wash your hands immediately after handling these items.  Do not share dishes, glasses, or other household items with the patient  Avoid sharing household items. You should not share dishes, drinking glasses, cups, eating utensils, towels, bedding, or other items with a patient who is confirmed to have, or being evaluated for, COVID-19 infection.  After the person uses these items, you should wash them thoroughly with soap and water.  Wash laundry thoroughly  Immediately remove and wash clothes or bedding that have blood, body fluids, and/or secretions or excretions, such as sweat, saliva, sputum, nasal mucus, vomit, urine, or feces, on them.  Wear gloves when handling laundry from the patient.  Read and follow directions on labels of laundry or clothing items and detergent. In general, wash and dry with the warmest temperatures recommended on the label.  Clean all areas the individual has used often  Clean all touchable surfaces, such as counters, tabletops, doorknobs,  bathroom fixtures, toilets, phones, keyboards, tablets, and bedside tables, every day. Also, clean any surfaces that may have blood, body fluids, and/or secretions or excretions on them.  Wear gloves when cleaning surfaces the patient has come in contact with.  Use a diluted bleach solution (e.g., dilute bleach with 1 part bleach and 10 parts water) or a household disinfectant with a label that says EPA-registered for coronaviruses. To make a bleach solution at home, add 1 tablespoon of bleach to 1 quart (4 cups) of water. For a larger supply, add  cup of bleach to 1 gallon (16 cups) of water.  Read labels of cleaning products and follow recommendations provided on product labels. Labels contain instructions for  safe and effective use of the cleaning product including precautions you should take when applying the product, such as wearing gloves or eye protection and making sure you have good ventilation during use of the product.  Remove gloves and wash hands immediately after cleaning.  Monitor yourself for signs and symptoms of illness Caregivers and household members are considered close contacts, should monitor their health, and will be asked to limit movement outside of the home to the extent possible. Follow the monitoring steps for close contacts listed on the symptom monitoring form.   ? If you have additional questions, contact your local health department or call the epidemiologist on call at [phone] (available 24/7). ? This guidance is subject to change. For the most up-to-date guidance from CDC, please refer to their website: YouBlogs.pl    Activity:  As tolerated with Full fall precautions use walker/cane & assistance as needed Discharge Instructions    Call MD for:  difficulty breathing, headache or visual disturbances   Complete by: As directed    Call MD for:  extreme fatigue   Complete by: As directed    Call MD for:  persistant dizziness or light-headedness   Complete by: As directed    Diet general   Complete by: As directed    Discharge instructions   Complete by: As directed    1.)  3 weeks of isolation from 4/20  2.)  You had some electrolyte abnormalities-you had elevated liver enzymes, your calcium levels were low, your albumin levels were low and you had some protein in your urine.  These were all likely from acute illness due to COVID-19-please ask your primary care MD to recheck these levels in the next few weeks-and if they are still abnormal-you will require further work-up.  3.)  2 view chest x-ray needs to be repeated in 4 to 6 weeks.   Follow with Primary MD  in 1-2 weeks  Please get a  complete blood count and chemistry panel checked by your Primary MD at your next visit, and again as instructed by your Primary MD.  Get Medicines reviewed and adjusted: Please take all your medications with you for your next visit with your Primary MD  Laboratory/radiological data: Please request your Primary MD to go over all hospital tests and procedure/radiological results at the follow up, please ask your Primary MD to get all Hospital records sent to his/her office.  In some cases, they will be blood work, cultures and biopsy results pending at the time of your discharge. Please request that your primary care M.D. follows up on these results.  Also Note the following: If you experience worsening of your admission symptoms, develop shortness of breath, life threatening emergency, suicidal or homicidal thoughts you must seek medical attention immediately by calling  911 or calling your MD immediately  if symptoms less severe.  You must read complete instructions/literature along with all the possible adverse reactions/side effects for all the Medicines you take and that have been prescribed to you. Take any new Medicines after you have completely understood and accpet all the possible adverse reactions/side effects.   Do not drive when taking Pain medications or sleeping medications (Benzodaizepines)  Do not take more than prescribed Pain, Sleep and Anxiety Medications. It is not advisable to combine anxiety,sleep and pain medications without talking with your primary care practitioner  Special Instructions: If you have smoked or chewed Tobacco  in the last 2 yrs please stop smoking, stop any regular Alcohol  and or any Recreational drug use.  Wear Seat belts while driving.  Please note: You were cared for by a hospitalist during your hospital stay. Once you are discharged, your primary care physician will handle any further medical issues. Please note that NO REFILLS for any discharge  medications will be authorized once you are discharged, as it is imperative that you return to your primary care physician (or establish a relationship with a primary care physician if you do not have one) for your post hospital discharge needs so that they can reassess your need for medications and monitor your lab values.   Increase activity slowly   Complete by: As directed      Allergies as of 02/04/2020   No Known Allergies     Medication List    TAKE these medications   Airborne Elderberry Chew Chew 2 tablets by mouth daily.   albuterol 108 (90 Base) MCG/ACT inhaler Commonly known as: VENTOLIN HFA Inhale 2 puffs into the lungs every 6 (six) hours as needed for wheezing or shortness of breath.   benzonatate 100 MG capsule Commonly known as: TESSALON Take 1 capsule (100 mg total) by mouth 3 (three) times daily as needed for up to 5 days for cough. What changed:   when to take this  reasons to take this   cholecalciferol 25 MCG (1000 UNIT) tablet Commonly known as: VITAMIN D3 Take 1,000 Units by mouth daily.   dexamethasone 6 MG tablet Commonly known as: DECADRON Take 1 tablet (6 mg total) by mouth daily.   VITAMIN C PO Take 500 mcg by mouth daily.            Durable Medical Equipment  (From admission, onward)         Start     Ordered   02/03/20 0711  For home use only DME oxygen  Once    Question Answer Comment  Length of Need 6 Months   Mode or (Route) Mask   Liters per Minute 2   Frequency Continuous (stationary and portable oxygen unit needed)   Oxygen conserving device Yes   Oxygen delivery system Gas      02/03/20 0710         Follow-up [facility] at [facility]. Go on 02/07/2020.   Specialty: Family Medicine Why: an appointment has been made for you at 11am. Please arrive 15 minutes early and bring any insurance cards you may have, and your ID.  Contact information: [place]  [facility] Follow up.   Why: home 02 Contact information: [place] [address] [phone]          No Known Allergies   Other  Procedures/Studies: CT Angio Chest PE W and/or Wo Contrast  Result Date: 01/27/2020 CLINICAL DATA:  Chills and elevated D-dimer. EXAM: CT ANGIOGRAPHY CHEST WITH CONTRAST TECHNIQUE: Multidetector CT imaging of the chest was performed using the standard protocol during bolus administration of intravenous contrast. Multiplanar CT image reconstructions and MIPs were obtained to evaluate the vascular anatomy. CONTRAST:  154mL OMNIPAQUE IOHEXOL 300 MG/ML  SOLN COMPARISON:  None. FINDINGS: Cardiovascular: Satisfactory opacification of the pulmonary arteries to the segmental level. No evidence of pulmonary embolism. Normal heart size. No pericardial effusion. Mediastinum/Nodes: No enlarged mediastinal, hilar, or axillary lymph nodes. Thyroid gland, trachea, and esophagus demonstrate no significant findings. Lungs/Pleura: Numerous small multifocal infiltrates are seen scattered throughout both lungs. Very small bilateral pleural effusions are seen. No pneumothorax is identified. Upper Abdomen: No acute abnormality. Musculoskeletal: No chest wall abnormality. Multilevel degenerative changes are seen within the mid to lower thoracic spine. Review of the MIP images confirms the above findings. IMPRESSION: 1. Numerous small multifocal infiltrates scattered throughout both lungs. 2. Very small bilateral pleural effusions. Electronically Signed   By: Virgina Norfolk M.D.   On: 01/27/2020 21:18   DG Chest Port 1 View  Result Date: 01/30/2020 CLINICAL DATA:  Shortness of breath, chest pain. EXAM: PORTABLE CHEST 1 VIEW COMPARISON:  January 27, 2020. FINDINGS: The heart size and mediastinal contours are within normal limits. No pneumothorax or pleural effusion is noted. Mild bibasilar opacities are noted concerning for  subsegmental atelectasis or possibly infiltrate. The visualized skeletal structures are unremarkable. IMPRESSION: Mild bibasilar opacities are noted concerning for subsegmental atelectasis or possibly infiltrate. Electronically Signed   By: Marijo Conception M.D.   On: 01/30/2020 12:59   DG Chest Portable 1 View  Result Date: 01/27/2020 CLINICAL DATA:  Chills. Positive COVID test by report. Started feeling unwell 2 days ago. EXAM: PORTABLE CHEST 1 VIEW COMPARISON:  None. FINDINGS: Normal heart, mediastinum and hila. Clear lungs.  No pleural effusion or pneumothorax. Skeletal structures are grossly intact. IMPRESSION: No active disease. Electronically Signed   By: Lajean Manes M.D.   On: 01/27/2020 15:00     TODAY-DAY OF DISCHARGE:  Subjective:   Jonathan Casey today has no headache,no chest abdominal pain,no new weakness tingling or numbness, feels much better wants to go home today.   Objective:   Blood pressure 114/72, pulse 88, temperature 97.8 F (36.6 C), temperature source Oral, resp. rate 18, height 5\' 9"  (1.753 m), weight 117 kg, SpO2 96 %.  Intake/Output Summary (Last 24 hours) at 02/04/2020 0959 Last data filed at 02/04/2020 0900 Gross per 24 hour  Intake 480 ml  Output 2300 ml  Net -1820 ml   Filed Weights   01/30/20 1214 02/01/20 0522  Weight: 119.3 kg 117 kg    Exam: Awake Alert, Oriented *3, No new F.N deficits, Normal affect [place].AT,PERRAL Supple Neck,No JVD, No cervical lymphadenopathy appriciated.  Symmetrical Chest wall movement, Good air movement bilaterally, CTAB RRR,No Gallops,Rubs or new Murmurs, No Parasternal Heave +ve B.Sounds, Abd Soft, Non tender, No organomegaly appriciated, No rebound -guarding or rigidity. No Cyanosis, Clubbing or edema, No new Rash or bruise   PERTINENT RADIOLOGIC STUDIES: CT Angio Chest PE W and/or Wo Contrast  Result Date: 01/27/2020 CLINICAL DATA:  Chills and elevated D-dimer. EXAM: CT ANGIOGRAPHY CHEST WITH CONTRAST TECHNIQUE:  Multidetector CT imaging of the chest was performed using the standard protocol during bolus administration of intravenous contrast. Multiplanar CT image reconstructions and MIPs were obtained to evaluate the vascular anatomy. CONTRAST:  141mL OMNIPAQUE IOHEXOL 300 MG/ML  SOLN COMPARISON:  None. FINDINGS: Cardiovascular: Satisfactory opacification of the pulmonary arteries to the segmental level. No evidence of pulmonary embolism. Normal heart size. No pericardial effusion. Mediastinum/Nodes: No enlarged mediastinal, hilar, or axillary lymph nodes. Thyroid gland, trachea, and esophagus demonstrate no significant findings. Lungs/Pleura: Numerous small multifocal infiltrates are seen scattered throughout both lungs. Very small bilateral pleural effusions are seen. No pneumothorax is identified. Upper Abdomen: No acute abnormality. Musculoskeletal: No chest wall abnormality. Multilevel degenerative changes are seen within the mid to lower thoracic spine. Review of the MIP images confirms the above findings. IMPRESSION: 1. Numerous small multifocal infiltrates scattered throughout both lungs. 2. Very small bilateral pleural effusions. Electronically Signed   By: Virgina Norfolk M.D.   On: 01/27/2020 21:18   DG Chest Port 1 View  Result Date: 01/30/2020 CLINICAL DATA:  Shortness of breath, chest pain. EXAM: PORTABLE CHEST 1 VIEW COMPARISON:  January 27, 2020. FINDINGS: The heart size and mediastinal contours are within normal limits. No pneumothorax or pleural effusion is noted. Mild bibasilar opacities are noted concerning for subsegmental atelectasis or possibly infiltrate. The visualized skeletal structures are unremarkable. IMPRESSION: Mild bibasilar opacities are noted concerning for subsegmental atelectasis or possibly infiltrate. Electronically Signed   By: Marijo Conception M.D.   On: 01/30/2020 12:59   DG Chest Portable 1 View  Result Date: 01/27/2020 CLINICAL DATA:  Chills. Positive COVID test by report.  Started feeling unwell 2 days ago. EXAM: PORTABLE CHEST 1 VIEW COMPARISON:  None. FINDINGS: Normal heart, mediastinum and hila. Clear lungs.  No pleural effusion or pneumothorax. Skeletal structures are grossly intact. IMPRESSION: No active disease. Electronically Signed   By: Lajean Manes M.D.   On: 01/27/2020 15:00     PERTINENT LAB RESULTS: CBC: Recent Labs    02/03/20 0454 02/04/20 0517  WBC 6.0 5.9  HGB 14.3 14.8  HCT 44.0 46.4  PLT 275 380   CMET CMP     Component Value Date/Time   NA 135 02/04/2020 0517   K 4.1 02/04/2020 0517   CL 101 02/04/2020 0517   CO2 25 02/04/2020 0517   GLUCOSE 180 (H) 02/04/2020 0517   BUN 23 (H) 02/04/2020 0517   CREATININE 1.21 02/04/2020 0517   CALCIUM 8.6 (L) 02/04/2020 0517   PROT 6.2 (L) 02/04/2020 0517   ALBUMIN 2.9 (L) 02/04/2020 0517   AST 48 (H) 02/04/2020 0517   ALT 59 (H) 02/04/2020 0517   ALKPHOS 46 02/04/2020 0517   BILITOT 0.7 02/04/2020 0517   GFRNONAA >60 02/04/2020 0517   GFRAA >60 02/04/2020 0517    GFR Estimated Creatinine Clearance: 91.1 mL/min (by C-G formula based on SCr of 1.21 mg/dL). No results for input(s): LIPASE, AMYLASE in the last 72 hours. No results for input(s): CKTOTAL, CKMB, CKMBINDEX, TROPONINI in the last 72 hours. Invalid input(s): POCBNP Recent Labs    02/03/20 0454 02/04/20 0517  DDIMER 0.53* 0.59*   No results for input(s): HGBA1C in the last 72 hours. No results for input(s): CHOL, HDL, LDLCALC, TRIG, CHOLHDL, LDLDIRECT in the last 72 hours. No results for input(s): TSH, T4TOTAL, T3FREE, THYROIDAB in the last 72 hours.  Invalid input(s): FREET3 Recent Labs    02/03/20 0454 02/04/20 0517  FERRITIN 569* 560*   Coags: No results for input(s): INR in the last 72 hours.  Invalid input(s): PT Microbiology: Recent Results (from the past 240 hour(s))  Blood Culture (routine x 2)     Status: None  Collection Time: 01/30/20  1:17 PM   Specimen: BLOOD  Result Value Ref Range Status    Specimen Description BLOOD RIGHT ANTECUBITAL  Final   Special Requests   Final    BOTTLES DRAWN AEROBIC AND ANAEROBIC Blood Culture adequate volume   Culture   Final    NO GROWTH 5 DAYS Performed at [facility], [address], [place], [place] [address]    Report Status 02/04/2020 FINAL  Final  Blood Culture (routine x 2)     Status: None   Collection Time: 01/30/20  2:29 PM   Specimen: BLOOD LEFT HAND  Result Value Ref Range Status   Specimen Description BLOOD LEFT HAND  Final   Special Requests   Final    BOTTLES DRAWN AEROBIC AND ANAEROBIC Blood Culture results may not be optimal due to an inadequate volume of blood received in culture bottles   Culture   Final    NO GROWTH 5 DAYS Performed at [facility], [address], [place], [place] [address]    Report Status 02/04/2020 FINAL  Final    FURTHER DISCHARGE INSTRUCTIONS:  Get Medicines reviewed and adjusted: Please take all your medications with you for your next visit with your Primary MD  Laboratory/radiological data: Please request your Primary MD to go over all hospital tests and procedure/radiological results at the follow up, please ask your Primary MD to get all Hospital records sent to his/her office.  In some cases, they will be blood work, cultures and biopsy results pending at the time of your discharge. Please request that your primary care M.D. goes through all the records of your hospital data and follows up on these results.  Also Note the following: If you experience worsening of your admission symptoms, develop shortness of breath, life threatening emergency, suicidal or homicidal thoughts you must seek medical attention immediately by calling 911 or calling your MD immediately  if symptoms less severe.  You must read complete instructions/literature along with all the possible adverse reactions/side effects for all the Medicines you take and that have been prescribed to you. Take any new  Medicines after you have completely understood and accpet all the possible adverse reactions/side effects.   Do not drive when taking Pain medications or sleeping medications (Benzodaizepines)  Do not take more than prescribed Pain, Sleep and Anxiety Medications. It is not advisable to combine anxiety,sleep and pain medications without talking with your primary care practitioner  Special Instructions: If you have smoked or chewed Tobacco  in the last 2 yrs please stop smoking, stop any regular Alcohol  and or any Recreational drug use.  Wear Seat belts while driving.  Please note: You were cared for by a hospitalist during your hospital stay. Once you are discharged, your primary care physician will handle any further medical issues. Please note that NO REFILLS for any discharge medications will be authorized once you are discharged, as it is imperative that you return to your primary care physician (or establish a relationship with a primary care physician if you do not have one) for your post hospital discharge needs so that they can reassess your need for medications and monitor your lab values.  Total Time spent coordinating discharge including counseling, education and face to face time equals 35 minutes.  SignedOren Binet 02/04/2020 9:59 AM

## 2020-02-04 NOTE — Progress Notes (Signed)
Jonathan Casey. to be D/C'd Home per MD order.  Discussed with the patient and all questions fully answered.  VSS, Skin clean, dry and intact without evidence of skin break down, no evidence of skin tears noted. IV catheter discontinued intact. Site without signs and symptoms of complications. Dressing and pressure applied.  An After Visit Summary was printed and given to the patient. Patient received prescription.  D/c education completed with patient/family including follow up instructions, medication list, d/c activities limitations if indicated, with other d/c instructions as indicated by MD - patient able to verbalize understanding, all questions fully answered.   Patient instructed to return to ED, call 911, or call MD for any changes in condition.   Patient escorted via [place], and D/C home via private auto.  Oxygen delivered to the room, and education given to the patient prior to D/C home.   [place] 02/04/2020 2:09 PM

## 2020-02-04 NOTE — Progress Notes (Addendum)
SATURATION QUALIFICATIONS: (This note is used to comply with regulatory documentation for home oxygen)  Patient Saturations on Room Air at Rest = 90%  Patient Saturations on Room Air while Ambulating = 83%  Patient Saturations on 2 Liters of oxygen while Ambulating = 93%  Please briefly explain why patient needs home oxygen:patient requires oxygen when ambulating down the hall, no complaints SOB or discomfort.

## 2020-02-04 NOTE — TOC Transition Note (Signed)
Transition of Care [facility]) - CM/SW Discharge Note   Patient Details  Name: Shaquel Hochstetler. MRN: [id number] Date of Birth: 05-Apr-1971  Transition of Care [facility]) CM/SW Contact:  Carles Collet, RN Phone Number: 02/04/2020, 10:54 AM   Clinical Narrative:   Patient to go home w home oxygen. Notified [place] w [employer], referral initially placed yesterday. Needs oxygen for home use delivered to room prior to DC. Nurse to send home w inhaler, decadron (only new med) less than $10, no MATCH needed.        Barriers to Discharge: Continued Medical Work up   Patient Goals and CMS Choice        Discharge Placement                       Discharge Plan and Services   Discharge Planning Services: CM Consult Post Acute Care Choice: Durable Medical Equipment          DME Arranged: Oxygen DME Agency: [employer] Date DME Agency Contacted: 02/04/20 Time DME Agency Contacted: 1000 Representative spoke with at DME Agency: [facility]            Social Determinants of Health ([place]) Interventions     Readmission Risk Interventions No flowsheet data found.

## 2020-02-04 NOTE — Discharge Instructions (Signed)
Person Under Monitoring Name: Jonathan Casey.  Location: [address] [place] [address]   Infection Prevention Recommendations for Individuals Confirmed to have, or Being Evaluated for, 2019 Novel Coronavirus (COVID-19) Infection Who Receive Care at Home  Individuals who are confirmed to have, or are being evaluated for, COVID-19 should follow the prevention steps below until a healthcare provider or local or state health department says they can return to normal activities.  Stay home except to get medical care You should restrict activities outside your home, except for getting medical care. Do not go to work, school, or public areas, and do not use public transportation or taxis.  Call ahead before visiting your doctor Before your medical appointment, call the healthcare provider and tell them that you have, or are being evaluated for, COVID-19 infection. This will help the healthcare provider's office take steps to keep other people from getting infected. Ask your healthcare provider to call the local or state health department.  Monitor your symptoms Seek prompt medical attention if your illness is worsening (e.g., difficulty breathing). Before going to your medical appointment, call the healthcare provider and tell them that you have, or are being evaluated for, COVID-19 infection. Ask your healthcare provider to call the local or state health department.  Wear a facemask You should wear a facemask that covers your nose and mouth when you are in the same room with other people and when you visit a healthcare provider. People who live with or visit you should also wear a facemask while they are in the same room with you.  Separate yourself from other people in your home As much as possible, you should stay in a different room from other people in your home. Also, you should use a separate bathroom, if available.  Avoid sharing household items You should  not share dishes, drinking glasses, cups, eating utensils, towels, bedding, or other items with other people in your home. After using these items, you should wash them thoroughly with soap and water.  Cover your coughs and sneezes Cover your mouth and nose with a tissue when you cough or sneeze, or you can cough or sneeze into your sleeve. Throw used tissues in a lined trash can, and immediately wash your hands with soap and water for at least 20 seconds or use an alcohol-based hand rub.  Wash your [facility] your hands often and thoroughly with soap and water for at least 20 seconds. You can use an alcohol-based hand sanitizer if soap and water are not available and if your hands are not visibly dirty. Avoid touching your eyes, nose, and mouth with unwashed hands.   Prevention Steps for Caregivers and Household Members of Individuals Confirmed to have, or Being Evaluated for, COVID-19 Infection Being Cared for in the Home  If you live with, or provide care at home for, a person confirmed to have, or being evaluated for, COVID-19 infection please follow these guidelines to prevent infection:  Follow healthcare provider's instructions Make sure that you understand and can help the patient follow any healthcare provider instructions for all care.  Provide for the patient's basic needs You should help the patient with basic needs in the home and provide support for getting groceries, prescriptions, and other personal needs.  Monitor the patient's symptoms If they are getting sicker, call his or her medical provider and tell them that the patient has, or is being evaluated for, COVID-19 infection. This will help the healthcare provider's  office take steps to keep other people from getting infected. Ask the healthcare provider to call the local or state health department.  Limit the number of people who have contact with the patient  If possible, have only one caregiver for the  patient.  Other household members should stay in another home or place of residence. If this is not possible, they should stay  in another room, or be separated from the patient as much as possible. Use a separate bathroom, if available.  Restrict visitors who do not have an essential need to be in the home.  Keep older adults, very young children, and other sick people away from the patient Keep older adults, very young children, and those who have compromised immune systems or chronic health conditions away from the patient. This includes people with chronic heart, lung, or kidney conditions, diabetes, and cancer.  Ensure good ventilation Make sure that shared spaces in the home have good air flow, such as from an air conditioner or an opened window, weather permitting.  Wash your hands often  Wash your hands often and thoroughly with soap and water for at least 20 seconds. You can use an alcohol based hand sanitizer if soap and water are not available and if your hands are not visibly dirty.  Avoid touching your eyes, nose, and mouth with unwashed hands.  Use disposable paper towels to dry your hands. If not available, use dedicated cloth towels and replace them when they become wet.  Wear a facemask and gloves  Wear a disposable facemask at all times in the room and gloves when you touch or have contact with the patient's blood, body fluids, and/or secretions or excretions, such as sweat, saliva, sputum, nasal mucus, vomit, urine, or feces.  Ensure the mask fits over your nose and mouth tightly, and do not touch it during use.  Throw out disposable facemasks and gloves after using them. Do not reuse.  Wash your hands immediately after removing your facemask and gloves.  If your personal clothing becomes contaminated, carefully remove clothing and launder. Wash your hands after handling contaminated clothing.  Place all used disposable facemasks, gloves, and other waste in a lined  container before disposing them with other household waste.  Remove gloves and wash your hands immediately after handling these items.  Do not share dishes, glasses, or other household items with the patient  Avoid sharing household items. You should not share dishes, drinking glasses, cups, eating utensils, towels, bedding, or other items with a patient who is confirmed to have, or being evaluated for, COVID-19 infection.  After the person uses these items, you should wash them thoroughly with soap and water.  Wash laundry thoroughly  Immediately remove and wash clothes or bedding that have blood, body fluids, and/or secretions or excretions, such as sweat, saliva, sputum, nasal mucus, vomit, urine, or feces, on them.  Wear gloves when handling laundry from the patient.  Read and follow directions on labels of laundry or clothing items and detergent. In general, wash and dry with the warmest temperatures recommended on the label.  Clean all areas the individual has used often  Clean all touchable surfaces, such as counters, tabletops, doorknobs, bathroom fixtures, toilets, phones, keyboards, tablets, and bedside tables, every day. Also, clean any surfaces that may have blood, body fluids, and/or secretions or excretions on them.  Wear gloves when cleaning surfaces the patient has come in contact with.  Use a diluted bleach solution (e.g., dilute bleach with 1  part bleach and 10 parts water) or a household disinfectant with a label that says EPA-registered for coronaviruses. To make a bleach solution at home, add 1 tablespoon of bleach to 1 quart (4 cups) of water. For a larger supply, add  cup of bleach to 1 gallon (16 cups) of water.  Read labels of cleaning products and follow recommendations provided on product labels. Labels contain instructions for safe and effective use of the cleaning product including precautions you should take when applying the product, such as wearing gloves or  eye protection and making sure you have good ventilation during use of the product.  Remove gloves and wash hands immediately after cleaning.  Monitor yourself for signs and symptoms of illness Caregivers and household members are considered close contacts, should monitor their health, and will be asked to limit movement outside of the home to the extent possible. Follow the monitoring steps for close contacts listed on the symptom monitoring form.   ? If you have additional questions, contact your local health department or call the epidemiologist on call at [phone] (available 24/7). ? This guidance is subject to change. For the most up-to-date guidance from [facility], please refer to their website: [email or web address]

## 2020-02-07 ENCOUNTER — Ambulatory Visit: Payer: Self-pay

## 2021-06-11 ENCOUNTER — Other Ambulatory Visit: Payer: Self-pay

## 2021-06-11 ENCOUNTER — Telehealth: Payer: Self-pay | Admitting: *Deleted

## 2021-06-11 ENCOUNTER — Ambulatory Visit (AMBULATORY_SURGERY_CENTER): Payer: 59 | Admitting: *Deleted

## 2021-06-11 VITALS — Ht 69.0 in | Wt 264.0 lb

## 2021-06-11 DIAGNOSIS — Z1211 Encounter for screening for malignant neoplasm of colon: Secondary | ICD-10-CM

## 2021-06-11 MED ORDER — SUTAB 1479-225-188 MG PO TABS: 1.0000 | ORAL_TABLET | ORAL | 0 refills | Status: DC

## 2021-06-11 NOTE — Progress Notes (Signed)
Patient's pre-visit was done today over the phone with the patient due to COVID-19 pandemic. Name,DOB and address verified. Insurance verified. Patient denies any allergies to Eggs and Soy. Patient denies any problems with anesthesia/sedation. Patient denies taking diet pills or blood thinners. No home Oxygen. Packet of Prep instructions mailed to patient including a copy of a consent form-pt is aware. Patient understands to call us back with any questions or concerns. Patient is aware of our care-partner policy and 0000000 safety protocol. Patient request pill prep-sutab given-coupon with Rx-pt aware.

## 2021-06-11 NOTE — Telephone Encounter (Signed)
Patient no show/answer PV appointment for today. I called patient x2, no answer, left a message for the patient to call us back today before 5 pm or the PV and procedure will be cancelled. ? ?

## 2021-06-11 NOTE — Telephone Encounter (Signed)
Patient called back-PV completed via phone.

## 2021-06-12 ENCOUNTER — Encounter: Payer: Self-pay | Admitting: Podiatrist

## 2021-06-12 ENCOUNTER — Ambulatory Visit (INDEPENDENT_AMBULATORY_CARE_PROVIDER_SITE_OTHER): Payer: 59 | Admitting: Podiatrist

## 2021-06-12 DIAGNOSIS — Z79899 Other long term (current) drug therapy: Secondary | ICD-10-CM | POA: Diagnosis not present

## 2021-06-12 DIAGNOSIS — M779 Enthesopathy, unspecified: Secondary | ICD-10-CM | POA: Diagnosis not present

## 2021-06-12 DIAGNOSIS — B351 Tinea unguium: Secondary | ICD-10-CM | POA: Diagnosis not present

## 2021-06-12 DIAGNOSIS — M778 Other enthesopathies, not elsewhere classified: Secondary | ICD-10-CM

## 2021-06-12 NOTE — Progress Notes (Signed)
HPI: Patient is 50 y.o. male who presents today for a diabetic foot exam.  The patient states he is a type II diabetic and does not recall his last glucose her last hemoglobin A1c.  He also relates he has some right ankle pain and relates he wears steel toed boots and walks on the outsides of his feet.  He states his toenails have become thick and unsightly.  He denies any numbness or tingling in his feet.  Patient Active Problem List   Diagnosis Date Noted   AKI (acute kidney injury) ([place]) 01/31/2020   Hypertensive urgency 01/31/2020   Chest discomfort 01/31/2020   Hyponatremia 01/31/2020   Acute respiratory failure with hypoxia ([place]) 01/31/2020   Obesity (BMI 30-39.9) 01/31/2020   Pneumonia due to COVID-19 virus 01/30/2020    Current Outpatient Medications on File Prior to Visit  Medication Sig Dispense Refill   amLODipine (NORVASC) 10 MG tablet Take 10 mg by mouth daily.     Ascorbic Acid (VITAMIN C PO) Take 500 mcg by mouth daily.     atorvastatin (LIPITOR) 20 MG tablet Take 20 mg by mouth daily.     cholecalciferol (VITAMIN D3) 25 MCG (1000 UNIT) tablet Take 1,000 Units by mouth daily.     etodolac (LODINE) 500 MG tablet Take 500 mg by mouth 2 (two) times daily.     glipiZIDE (GLUCOTROL XL) 5 MG 24 hr tablet Take 5 mg by mouth daily.     losartan-hydrochlorothiazide (HYZAAR) 50-12.5 MG tablet Take 1 tablet by mouth daily.     metFORMIN (GLUCOPHAGE) 500 MG tablet Take 500 mg by mouth 2 (two) times daily.     Misc Natural Products (AIRBORNE ELDERBERRY) CHEW Chew 2 tablets by mouth daily.     Sodium Sulfate-Mag Sulfate-KCl (SUTAB) [phone] MG TABS Take 1 kit by mouth as directed. MANUFACTURER CODES!! BIN: [id number] PCN: CN GROUP: [id number] MEMBER ID: [id number];[employer] INS[employer]ANCE ;NO PRIOR AUTHORIZATION 24 tablet 0   tiZANidine (ZANAFLEX) 4 MG tablet Take 4 mg by mouth every 8 (eight) hours as needed.     No current facility-administered medications on file prior to  visit.    No Known Allergies  Review of Systems No fevers, chills, nausea, muscle aches, no difficulty breathing, no calf pain, no chest pain or shortness of breath.   Physical Exam  GENERAL APPEARANCE: Alert, conversant. Appropriately groomed. No acute distress.   VASCULAR: Pedal pulses palpable DP and PT bilateral.  Capillary refill time is immediate to all digits,  Proximal to distal cooling it warm to warm.  Digital perfusion adequate.   NEUROLOGIC: sensation is intact to 5.07 monofilament at 5/5 sites bilateral.  Light touch is intact bilateral, vibratory sensation intact bilateral  MUSCULOSKELETAL: acceptable muscle strength, tone and stability bilateral.    Rectus to high arch foot type noted.  some pain along the lateral aspect of the right foot is noted along the fifth metatarsal and its base.  Extensor tendon pain along the lateral aspect of the right foot is also noted.  DERMATOLOGIC: skin is warm, supple, and dry.  No open lesions noted.  No rash, no pre ulcerative lesions. Digital nails thickened, discolored, dystrophic and brittle.  They do appear clinically mycotic x10.    Assessment  1. Onychomycosis   2. Tendonitis   3. Capsulitis of foot, right   4. Long-term use of high-risk medication      Plan  Exam findings discussed with the patient.  Discussed he does have  areas of discomfort on the lateral aspect of the right foot likely due to the way that he turns the foot in the shoes.  Discussed orthotics with a wedge on the outside to try and make his foot in a more vertical alignment might be helpful.  I gave him the sheet to call his insurance to see if they can be covered by insurance and will he will follow-up with EJ regarding the orthotics. He would also like to treat the fungal toenails if possible.  Briefly discussed Lamisil therapy and discussed he will have to get a liver function panel before deciding if he is a candidate for this medication.  This was  ordered for him at today's visit and we will review the results when they become available.  If oral therapy is not indicated we will try topical for him through [employer].

## 2021-06-12 NOTE — Patient Instructions (Signed)
Terbinafine tablets What is this medication? TERBINAFINE (TER bin a feen) is an antifungal medicine. It is used to treatcertain kinds of fungal or yeast infections. This medicine may be used for other purposes; ask your health care provider orpharmacist if you have questions. COMMON BRAND NAME(S): Lamisil, Terbinex What should I tell my care team before I take this medication? They need to know if you have any of these conditions: drink alcoholic beverages kidney disease liver disease an unusual or allergic reaction to terbinafine, other medicines, foods, dyes, or preservatives pregnant or trying to get pregnant breast-feeding How should I use this medication? Take this medicine by mouth with a full glass of water. Follow the directions on the prescription label. You can take this medicine with food or on an empty stomach. Take your medicine at regular intervals. Do not take your medicine more often than directed. Do not skip doses or stop your medicine early even ifyou feel better. Do not stop taking except on your doctor's advice. A special MedGuide will be given to you by the pharmacist with eachprescription and refill. Be sure to read this information carefully each time. Talk to your pediatrician regarding the use of this medicine in children.Special care may be needed. Overdosage: If you think you have taken too much of this medicine contact apoison control center or emergency room at once. NOTE: This medicine is only for you. Do not share this medicine with others. What if I miss a dose? If you miss a dose, take it as soon as you can. If it is almost time for yournext dose, take only that dose. Do not take double or extra doses. What may interact with this medication? Do not take this medicine with any of the following medications: thioridazine This medicine may also interact with the following medications: beta-blockers caffeine cimetidine cyclosporine medicines for depression,  anxiety, or psychotic disturbances medicines for fungal infections like fluconazole and ketoconazole medicines for irregular heartbeat like amiodarone, flecainide and propafenone rifampin warfarin This list may not describe all possible interactions. Give your health care provider a list of all the medicines, herbs, non-prescription drugs, or dietary supplements you use. Also tell them if you smoke, drink alcohol, or use illegaldrugs. Some items may interact with your medicine. What should I watch for while using this medication? Visit your doctor or health care provider regularly. Tell your doctor right away if you have nausea or vomiting, loss of appetite, stomach pain on your right upper side, yellow skin, dark urine, light stools, or are over tired. Some fungal infections need many weeks or months of treatment to cure. If you are taking this medicine for a long time, you will need to have important bloodwork done. This medicine may cause serious skin reactions. They can happen weeks to months after starting the medicine. Contact your health care provider right away if you notice fevers or flu-like symptoms with a rash. The rash may be red or purple and then turn into blisters or peeling of the skin. Or, you might notice a red rash with swelling of the face, lips or lymph nodes in your neck or underyour arms. What side effects may I notice from receiving this medication? Side effects that you should report to your doctor or health care professionalas soon as possible: allergic reactions like skin rash or hives, swelling of the face, lips, or tongue changes in vision dark urine fever or infection general ill feeling or flu-like symptoms light-colored stools loss of appetite, nausea rash, fever,   and swollen lymph nodes redness, blistering, peeling or loosening of the skin, including inside the mouth right upper belly pain unusually weak or tired yellowing of the eyes or skin Side effects that  usually do not require medical attention (report to yourdoctor or health care professional if they continue or are bothersome): changes in taste diarrhea hair loss muscle or joint pain stomach gas stomach upset This list may not describe all possible side effects. Call your doctor for medical advice about side effects. You may report side effects to FDA at1-800-FDA-1088. Where should I keep my medication? Keep out of the reach of children. Store at room temperature below 25 degrees C (77 degrees F). Protect fromlight. Throw away any unused medicine after the expiration date. NOTE: This sheet is a summary. It may not cover all possible information. If you have questions about this medicine, talk to your doctor, pharmacist, orhealth care provider.  2022 Elsevier/Gold Standard (2018-12-31 15:37:07)  

## 2021-06-13 ENCOUNTER — Other Ambulatory Visit: Payer: Self-pay

## 2021-06-13 ENCOUNTER — Ambulatory Visit: Payer: Self-pay

## 2021-06-13 ENCOUNTER — Ambulatory Visit (INDEPENDENT_AMBULATORY_CARE_PROVIDER_SITE_OTHER): Payer: 59 | Admitting: Surgery

## 2021-06-13 ENCOUNTER — Encounter: Payer: Self-pay | Admitting: Surgery

## 2021-06-13 DIAGNOSIS — M25571 Pain in right ankle and joints of right foot: Secondary | ICD-10-CM

## 2021-06-13 DIAGNOSIS — M545 Low back pain, unspecified: Secondary | ICD-10-CM | POA: Diagnosis not present

## 2021-06-13 DIAGNOSIS — S46911A Strain of unspecified muscle, fascia and tendon at shoulder and upper arm level, right arm, initial encounter: Secondary | ICD-10-CM

## 2021-06-13 DIAGNOSIS — S39012A Strain of muscle, fascia and tendon of lower back, initial encounter: Secondary | ICD-10-CM | POA: Diagnosis not present

## 2021-06-13 DIAGNOSIS — M25511 Pain in right shoulder: Secondary | ICD-10-CM

## 2021-06-13 LAB — HEPATIC FUNCTION PANEL
AG Ratio: 1.5 (calc) (ref 1.0–2.5)
ALT: 18 U/L (ref 9–46)
AST: 23 U/L (ref 10–35)
Albumin: 4.3 g/dL (ref 3.6–5.1)
Alkaline phosphatase (APISO): 81 U/L (ref 35–144)
Bilirubin, Direct: 0.1 mg/dL (ref 0.0–0.2)
Globulin: 2.8 g/dL (calc) (ref 1.9–3.7)
Indirect Bilirubin: 0.5 mg/dL (calc) (ref 0.2–1.2)
Total Bilirubin: 0.6 mg/dL (ref 0.2–1.2)
Total Protein: 7.1 g/dL (ref 6.1–8.1)

## 2021-06-13 NOTE — Progress Notes (Signed)
Office Visit Note   Patient: Jonathan Casey.           Date of Birth: 08-Jun-1971           MRN: [id number] Visit Date: 06/13/2021              Requested by: Benito Mccreedy, MD [id number] [place],  [place] [address] PCP: Benito Mccreedy, MD   Assessment & Plan: Visit Diagnoses:  1. Acute pain of right shoulder   2. Pain in right ankle and joints of right foot   3. Acute midline low back pain, unspecified whether sciatica present   4. Strain of lumbar region, initial encounter   5. Strain of right shoulder, initial encounter     Plan: Advised patient at this point recommend conservative treatment.  He may use ice or heat off-and-on as needed.  Can continue oral NSAID and muscle relaxer per his primary care provider.  Regards to his right foot and ankle pain he is already under the care of a podiatrist and he will continue follow-up with him for that.  For his lumbar strain patient can also do some gentle stretching exercises.  I stressed to him the importance of taking it easy and not trying to overdo it because this will continue to aggravate his current problem.  Follow-up with Dr. Lorin Mercy in a few weeks for recheck.  If his shoulder continues to be a problem he may consider doing a subacromial injection there.  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  All questions answered.  Follow-Up Instructions: Return in about 3 weeks (around 07/04/2021) for with dr yates.   Orders:  Orders Placed This Encounter  Procedures   XR Lumbar Spine 2-3 Views   XR Shoulder Right   XR Ankle Complete Right   No orders of the defined types were placed in this encounter.     Procedures: No procedures performed   Clinical Data: No additional findings.   Subjective: Chief Complaint  Patient presents with   Lower Back - Pain    HPI 50 year old black male who is new patient to clinic comes in  with complaints of low back pain, right shoulder pain and right ankle pain.  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  He had some pain in the right shoulder, right side of his low back and also right ankle after this occurred.  He was seen by podiatrist yesterday who diagnosed him with tendinitis of the ankle.  Patient states that he ordered some custom orthotics and is scheduled to follow-up with him for that.  Complains of having right-sided low back spasms with some soreness around his buttock.  Also aggravated his shoulder.  Some discomfort with overhead activity and reaching on his back.  No cervical spinal radicular component.  Patient has been using oral NSAID and heat off and on.  Primary care provider prescribed muscle relaxer a couple weeks ago.  No complaints of lower extremity radiculopathy. Review of Systems No current cardiac pulmonary GI GU issues  Objective: Vital Signs: There were no vitals taken for this visit.  Physical Exam HENT:     Head: Normocephalic and atraumatic.  Eyes:     Extraocular Movements: Extraocular movements intact.  Pulmonary:     Effort: No respiratory distress.  Musculoskeletal:  Comments: Gait is normal.  Right shoulder has good range of motion but with slight discomfort.  Mildly positive impingement test.  Negative drop arm test.  Good cuff strength.  Cervical spine unremarkable.  Right-sided lumbar paraspinal tenderness/spasm.  Sciatic notch nontender.  Negative log roll.  negative straight leg raise.  Ankle good range of motion.  Neurological:     General: No focal deficit present.     Mental Status: He is alert and oriented to person, place, and time.  Psychiatric:        Mood and Affect: Mood normal.    Ortho Exam  Specialty Comments:  No specialty comments available.  Imaging: No results found.   PMFS History: Patient Active Problem List   Diagnosis Date  Noted   AKI (acute kidney injury) ([facility]) 01/31/2020   Hypertensive urgency 01/31/2020   Chest discomfort 01/31/2020   Hyponatremia 01/31/2020   Acute respiratory failure with hypoxia ([facility]) 01/31/2020   Obesity (BMI 30-39.9) 01/31/2020   Pneumonia due to COVID-19 virus 01/30/2020   Past Medical History:  Diagnosis Date   Diabetes mellitus without complication (HCC)    Hyperlipidemia    Hypertension    Sickle cell trait (HCC)     Family History  Problem Relation Age of Onset   Colon cancer Neg Hx    Colon polyps Neg Hx    Esophageal cancer Neg Hx    Rectal cancer Neg Hx    Stomach cancer Neg Hx     Past Surgical History:  Procedure Laterality Date   CYST EXCISION PERINEAL     KIDNEY STONE SURGERY     Social History   Occupational History   Not on file  Tobacco Use   Smoking status: Never   Smokeless tobacco: Never  Vaping Use   Vaping Use: Never used  Substance and Sexual Activity   Alcohol use: Yes    Comment: occ   Drug use: Never   Sexual activity: Not on file

## 2021-06-20 ENCOUNTER — Telehealth: Payer: Self-pay | Admitting: *Deleted

## 2021-06-20 ENCOUNTER — Other Ambulatory Visit: Payer: Self-pay | Admitting: Podiatrist

## 2021-06-20 MED ORDER — TERBINAFINE HCL 250 MG PO TABS: 250.0000 mg | ORAL_TABLET | Freq: Every day | ORAL | 0 refills | Status: AC

## 2021-06-20 NOTE — Telephone Encounter (Signed)
Called patient - L/M - informed him of results and that if he wanted to proceed with treatment the prescription has already been sent over to the pharmacy. Review instructions for taking the medication as well.

## 2021-06-20 NOTE — Telephone Encounter (Signed)
-----   Message from Bronson Ing, DPM sent at 06/20/2021  1:58 PM EDT ----- Regarding: ok to start lamisil-  was called into his pharmacy This patients liver function test is great-  if he wants to treat the toenails, I have called in Lamisil for him.  He will take every day 90 days.  Don't expect to see change in nail for 4-6 months.  Thanks!!!   ----- Message ----- From: Interface, [facility] Results In Sent: 06/13/2021  12:25 AM EDT To: Bronson Ing, DPM

## 2021-06-24 ENCOUNTER — Encounter: Payer: Self-pay | Admitting: Gastroenterology

## 2021-06-24 ENCOUNTER — Ambulatory Visit (AMBULATORY_SURGERY_CENTER): Payer: 59 | Admitting: Gastroenterology

## 2021-06-24 ENCOUNTER — Other Ambulatory Visit: Payer: Self-pay

## 2021-06-24 VITALS — BP 131/96 | HR 80 | Temp 96.6°F | Resp 10 | Ht 69.0 in | Wt 264.0 lb

## 2021-06-24 DIAGNOSIS — K621 Rectal polyp: Secondary | ICD-10-CM

## 2021-06-24 DIAGNOSIS — D123 Benign neoplasm of transverse colon: Secondary | ICD-10-CM | POA: Diagnosis not present

## 2021-06-24 DIAGNOSIS — D128 Benign neoplasm of rectum: Secondary | ICD-10-CM

## 2021-06-24 DIAGNOSIS — D125 Benign neoplasm of sigmoid colon: Secondary | ICD-10-CM | POA: Diagnosis not present

## 2021-06-24 DIAGNOSIS — D129 Benign neoplasm of anus and anal canal: Secondary | ICD-10-CM

## 2021-06-24 DIAGNOSIS — D122 Benign neoplasm of ascending colon: Secondary | ICD-10-CM

## 2021-06-24 DIAGNOSIS — Z1211 Encounter for screening for malignant neoplasm of colon: Secondary | ICD-10-CM

## 2021-06-24 DIAGNOSIS — D124 Benign neoplasm of descending colon: Secondary | ICD-10-CM | POA: Diagnosis not present

## 2021-06-24 DIAGNOSIS — D120 Benign neoplasm of cecum: Secondary | ICD-10-CM

## 2021-06-24 MED ORDER — SODIUM CHLORIDE 0.9 % IV SOLN: 500.0000 mL | INTRAVENOUS | Status: DC

## 2021-06-24 NOTE — Progress Notes (Signed)
PT to PACU sleepy  Vital signs stable

## 2021-06-24 NOTE — Progress Notes (Signed)
Called to room to assist during endoscopic procedure.  Patient ID and intended procedure confirmed with present staff. Received instructions for my participation in the procedure from the performing physician.  

## 2021-06-24 NOTE — Progress Notes (Signed)
Pt's states no medical or surgical changes since previsit or office visit. 

## 2021-06-24 NOTE — Progress Notes (Signed)
[facility] History and Physical   Primary Care Physician:  Benito Mccreedy, MD   Reason for Procedure:   Colon cancer screening   Plan:    Screening colonoscopy     HPI: Jonathan Casey. is a 50 y.o. male undergoing initial average risk screening colonoscopy.  He has no chronic GI symptoms and no family history of colon cancer.   Past Medical History:  Diagnosis Date   Diabetes mellitus without complication ([facility])    Hyperlipidemia    Hypertension    Sickle cell trait ([place])     Past Surgical History:  Procedure Laterality Date   CYST EXCISION PERINEAL     KIDNEY STONE SURGERY      Prior to Admission medications   Medication Sig Start Date End Date Taking? Authorizing Provider  Ascorbic Acid (VITAMIN C PO) Take 500 mcg by mouth daily.   Yes [provider]  amLODipine (NORVASC) 10 MG tablet Take 10 mg by mouth daily. 05/14/21   [provider]  atorvastatin (LIPITOR) 20 MG tablet Take 20 mg by mouth daily. 05/14/21   [provider]  cholecalciferol (VITAMIN D3) 25 MCG (1000 UNIT) tablet Take 1,000 Units by mouth daily.    [provider]  etodolac (LODINE) 500 MG tablet Take 500 mg by mouth 2 (two) times daily. 05/14/21   [provider]  glipiZIDE (GLUCOTROL XL) 5 MG 24 hr tablet Take 5 mg by mouth daily. 05/29/21   [provider]  losartan-hydrochlorothiazide (HYZAAR) 50-12.5 MG tablet Take 1 tablet by mouth daily.    [provider]  metFORMIN (GLUCOPHAGE) 500 MG tablet Take 500 mg by mouth 2 (two) times daily. 05/14/21   [provider]  Misc Natural Products (AIRBORNE ELDERBERRY) CHEW Chew 2 tablets by mouth daily.    [provider]  terbinafine (LAMISIL) 250 MG tablet Take 1 tablet (250 mg total) by mouth daily. Take daily for 90 days (toenail fungus) 06/20/21   Egerton, Viona Gilmore, DPM  tiZANidine (ZANAFLEX) 4 MG tablet Take 4 mg by mouth every 8 (eight) hours as needed. 05/29/21    [provider]    Current Outpatient Medications  Medication Sig Dispense Refill   Ascorbic Acid (VITAMIN C PO) Take 500 mcg by mouth daily.     amLODipine (NORVASC) 10 MG tablet Take 10 mg by mouth daily.     atorvastatin (LIPITOR) 20 MG tablet Take 20 mg by mouth daily.     cholecalciferol (VITAMIN D3) 25 MCG (1000 UNIT) tablet Take 1,000 Units by mouth daily.     etodolac (LODINE) 500 MG tablet Take 500 mg by mouth 2 (two) times daily.     glipiZIDE (GLUCOTROL XL) 5 MG 24 hr tablet Take 5 mg by mouth daily.     losartan-hydrochlorothiazide (HYZAAR) 50-12.5 MG tablet Take 1 tablet by mouth daily.     metFORMIN (GLUCOPHAGE) 500 MG tablet Take 500 mg by mouth 2 (two) times daily.     Misc Natural Products (AIRBORNE ELDERBERRY) CHEW Chew 2 tablets by mouth daily.     terbinafine (LAMISIL) 250 MG tablet Take 1 tablet (250 mg total) by mouth daily. Take daily for 90 days (toenail fungus) 90 tablet 0   tiZANidine (ZANAFLEX) 4 MG tablet Take 4 mg by mouth every 8 (eight) hours as needed.     Current Facility-Administered Medications  Medication Dose Route Frequency Provider Last Rate Last Admin   0.9 %  sodium chloride infusion  500 mL Intravenous Continuous Candis Schatz,  Gladstone Pih, MD        Allergies as of 06/24/2021   (No Known Allergies)    Family History  Problem Relation Age of Onset   Colon cancer Neg Hx    Colon polyps Neg Hx    Esophageal cancer Neg Hx    Rectal cancer Neg Hx    Stomach cancer Neg Hx     Social History   Socioeconomic History   Marital status: Single    Spouse name: Not on file   Number of children: Not on file   Years of education: Not on file   Highest education level: Not on file  Occupational History   Not on file  Tobacco Use   Smoking status: Never   Smokeless tobacco: Never  Vaping Use   Vaping Use: Never used  Substance and Sexual Activity   Alcohol use: Yes    Comment: occ   Drug use: Never   Sexual activity: Not on file   Other Topics Concern   Not on file  Social History Narrative   Not on file   Social Determinants of Health   Financial Resource Strain: Not on file  Food Insecurity: Not on file  Transportation Needs: Not on file  Physical Activity: Not on file  Stress: Not on file  Social Connections: Not on file  Intimate Partner Violence: Not on file    Review of Systems: All other review of systems negative except as mentioned in the HPI.  Physical Exam: Vital signs BP 126/77   Pulse 91   Temp (!) 96.6 F (35.9 C)   Ht '5\' 9"'$  (1.753 m)   Wt 264 lb (119.7 kg)   SpO2 98%   BMI 38.99 kg/m   General:   Alert,  Well-developed, well-nourished, pleasant and cooperative in NAD, MP3 Lungs:  Clear throughout to auscultation.   Heart:  Regular rate and rhythm; no murmurs, clicks, rubs,  or gallops. Abdomen:  Soft, nontender and nondistended. Normal bowel sounds.   Neuro/Psych:   Normal mood and affect. A and O x 3   Kennedy Bohanon E. Candis Schatz, MD [facility]

## 2021-06-24 NOTE — Op Note (Signed)
[facility] Patient Name: Jonathan Casey Procedure Date: 06/24/2021 8:33 AM MRN: [id number] Endoscopist: Nicki Reaper E. Candis Schatz , MD Age: 50 Referring MD:  Date of Birth: 1971-09-11 Gender: Male Account #: [id number] Procedure:                Colonoscopy Indications:              Screening for colorectal malignant neoplasm, This                            is the patient's first colonoscopy Medicines:                Monitored Anesthesia Care Procedure:                Pre-Anesthesia Assessment:                           - Prior to the procedure, a History and Physical                            was performed, and patient medications and                            allergies were reviewed. The patient's tolerance of                            previous anesthesia was also reviewed. The risks                            and benefits of the procedure and the sedation                            options and risks were discussed with the patient.                            All questions were answered, and informed consent                            was obtained. Prior Anticoagulants: The patient has                            taken no previous anticoagulant or antiplatelet                            agents. ASA Grade Assessment: II - A patient with                            mild systemic disease. After reviewing the risks                            and benefits, the patient was deemed in                            satisfactory condition to undergo the procedure.  After obtaining informed consent, the colonoscope                            was passed under direct vision. Throughout the                            procedure, the patient's blood pressure, pulse, and                            oxygen saturations were monitored continuously. The                            CF [id number] #[id number] was introduced through the anus                            and advanced to the  the terminal ileum, with                            identification of the appendiceal orifice and IC                            valve. The colonoscopy was performed without                            difficulty. The patient tolerated the procedure                            well. The quality of the bowel preparation was                            adequate. The terminal ileum, ileocecal valve,                            appendiceal orifice, and rectum were photographed. Scope In: 8:45:35 AM Scope Out: 9:11:07 AM Scope Withdrawal Time: 0 hours 22 minutes 3 seconds  Total Procedure Duration: 0 hours 25 minutes 32 seconds  Findings:                 The perianal and digital rectal examinations were                            normal. Pertinent negatives include normal                            sphincter tone and no palpable rectal lesions.                           Two sessile polyps were found in the cecum. The                            polyps were 2 to 4 mm in size. These polyps were                            removed with a  cold snare. Resection and retrieval                            were complete. Estimated blood loss was minimal.                           A 5 mm polyp was found in the ascending colon. The                            polyp was sessile. The polyp was removed with a                            cold snare. Resection and retrieval were complete.                            Estimated blood loss was minimal.                           A 4 mm polyp was found in the splenic flexure. The                            polyp was sessile. The polyp was removed with a                            cold snare. Resection and retrieval were complete.                            Estimated blood loss was minimal.                           Two sessile polyps were found in the descending                            colon. The polyps were 3 to 4 mm in size. These                            polyps were  removed with a cold snare. Resection                            and retrieval were complete. Estimated blood loss                            was minimal.                           A 4 mm polyp was found in the sigmoid colon. The                            polyp was sessile. The polyp was removed with a                            cold snare. Resection and retrieval were complete.  Estimated blood loss was minimal.                           A 3 mm polyp was found in the sigmoid colon. The                            polyp was flat. The polyp was removed with a cold                            snare. Resection and retrieval were complete.                            Estimated blood loss was minimal.                           Three sessile polyps were found in the rectum. The                            polyps were 2 to 3 mm in size. These polyps were                            removed with a cold snare. Resection and retrieval                            were complete. Estimated blood loss was minimal.                           The exam was otherwise normal throughout the                            examined colon.                           The terminal ileum appeared normal.                           The retroflexed view of the distal rectum and anal                            verge was normal and showed no anal or rectal                            abnormalities. Complications:            No immediate complications. Estimated Blood Loss:     Estimated blood loss was minimal. Impression:               - Two 2 to 4 mm polyps in the cecum, removed with a                            cold snare. Resected and retrieved.                           - One 5 mm polyp in the ascending colon,  removed                            with a cold snare. Resected and retrieved.                           - One 4 mm polyp at the splenic flexure, removed                            with a cold  snare. Resected and retrieved.                           - Two 3 to 4 mm polyps in the descending colon,                            removed with a cold snare. Resected and retrieved.                           - One 4 mm polyp in the sigmoid colon, removed with                            a cold snare. Resected and retrieved.                           - One 3 mm polyp in the sigmoid colon, removed with                            a cold snare. Resected and retrieved.                           - Three 2 to 3 mm polyps in the rectum, removed                            with a cold snare. Resected and retrieved.                           - The examined portion of the ileum was normal.                           - The distal rectum and anal verge are normal on                            retroflexion view. Recommendation:           - Patient has a contact number available for                            emergencies. The signs and symptoms of potential                            delayed complications were discussed with the  patient. Return to normal activities tomorrow.                            Written discharge instructions were provided to the                            patient.                           - Resume previous diet.                           - Continue present medications.                           - Await pathology results.                           - Repeat colonoscopy (date not yet determined) for                            surveillance based on pathology results. Ambers Iyengar E. Candis Schatz, MD 06/24/2021 9:20:27 AM This report has been signed electronically.

## 2021-06-24 NOTE — Patient Instructions (Signed)
Handout on polyps given. Await pathology results. Repeat colonoscopy for surveillance will be determined based off of pathology results.   YOU HAD AN ENDOSCOPIC PROCEDURE TODAY AT [facility]:   Refer to the procedure report that was given to you for any specific questions about what was found during the examination.  If the procedure report does not answer your questions, please call your gastroenterologist to clarify.  If you requested that your care partner not be given the details of your procedure findings, then the procedure report has been included in a sealed envelope for you to review at your convenience later.  YOU SHOULD EXPECT: Some feelings of bloating in the abdomen. Passage of more gas than usual.  Walking can help get rid of the air that was put into your GI tract during the procedure and reduce the bloating. If you had a lower endoscopy (such as a colonoscopy or flexible sigmoidoscopy) you may notice spotting of blood in your stool or on the toilet paper. If you underwent a bowel prep for your procedure, you may not have a normal bowel movement for a few days.  Please Note:  You might notice some irritation and congestion in your nose or some drainage.  This is from the oxygen used during your procedure.  There is no need for concern and it should clear up in a day or so.  SYMPTOMS TO REPORT IMMEDIATELY:  Following lower endoscopy (colonoscopy or flexible sigmoidoscopy):  Excessive amounts of blood in the stool  Significant tenderness or worsening of abdominal pains  Swelling of the abdomen that is new, acute  Fever of 100F or higher   For urgent or emergent issues, a gastroenterologist can be reached at any hour by calling [phone]. Do not use MyChart messaging for urgent concerns.    DIET:  We do recommend a small meal at first, but then you may proceed to your regular diet.  Drink plenty of fluids but you should avoid alcoholic beverages for 24  hours.  ACTIVITY:  You should plan to take it easy for the rest of today and you should NOT DRIVE or use heavy machinery until tomorrow (because of the sedation medicines used during the test).    FOLLOW UP: Our staff will call the number listed on your records 48-72 hours following your procedure to check on you and address any questions or concerns that you may have regarding the information given to you following your procedure. If we do not reach you, we will leave a message.  We will attempt to reach you two times.  During this call, we will ask if you have developed any symptoms of COVID 19. If you develop any symptoms (ie: fever, flu-like symptoms, shortness of breath, cough etc.) before then, please call [phone].  If you test positive for Covid 19 in the 2 weeks post procedure, please call and report this information to [place].    If any biopsies were taken you will be contacted by phone or by letter within the next 1-3 weeks.  Please call us at [phone] if you have not heard about the biopsies in 3 weeks.    SIGNATURES/CONFIDENTIALITY: You and/or your care partner have signed paperwork which will be entered into your electronic medical record.  These signatures attest to the fact that that the information above on your After Visit Summary has been reviewed and is understood.  Full responsibility of the confidentiality of this discharge information lies with you and/or  your care-partner.

## 2021-06-26 ENCOUNTER — Telehealth: Payer: Self-pay | Admitting: *Deleted

## 2021-06-26 ENCOUNTER — Telehealth: Payer: Self-pay

## 2021-06-26 NOTE — Telephone Encounter (Signed)
Left message on follow up call. 

## 2021-06-26 NOTE — Telephone Encounter (Signed)
  Follow up Call-  Call back number 06/24/2021  Post procedure Call Back phone  # [phone]  Permission to leave phone message Yes   LMOM to call back with any questions or concerns.  Also, call back if patient has developed fever, respiratory issues or been dx with COVID or had any family members or close contacts diagnosed since her procedure.

## 2021-06-27 ENCOUNTER — Ambulatory Visit: Payer: 59 | Admitting: Surgery

## 2021-07-03 ENCOUNTER — Ambulatory Visit: Payer: 59 | Admitting: Orthopaedic Surgery

## 2021-07-04 ENCOUNTER — Encounter: Payer: Self-pay | Admitting: Gastroenterology

## 2021-07-12 ENCOUNTER — Telehealth: Payer: Self-pay | Admitting: Podiatrist

## 2021-07-12 NOTE — Telephone Encounter (Signed)
Left message for pt to call to r/s appt on 10.28 as Ej is out of the office.. Has to be with EJ.Marland KitchenMarland Kitchen

## 2021-07-24 ENCOUNTER — Encounter: Payer: 59 | Attending: Internal Medicine | Admitting: Registered"

## 2021-07-30 ENCOUNTER — Telehealth: Payer: Self-pay | Admitting: Podiatrist

## 2021-07-30 NOTE — Telephone Encounter (Signed)
Left message for pt to call to r/s appt for 10.28 as Ej's schedule has changed and he will be out of the office... this is 2nd message I have left. Next available 11.11

## 2021-08-02 ENCOUNTER — Other Ambulatory Visit: Payer: 59

## 2021-08-27 IMAGING — DX DG CHEST 1V PORT
1 series · 1 of 1 positions shown · non-contrast
Comparison: None.

CLINICAL DATA: Chills. Positive COVID test by report. Started
feeling unwell 2 days ago.

EXAM:
PORTABLE CHEST 1 VIEW

[chest ap]
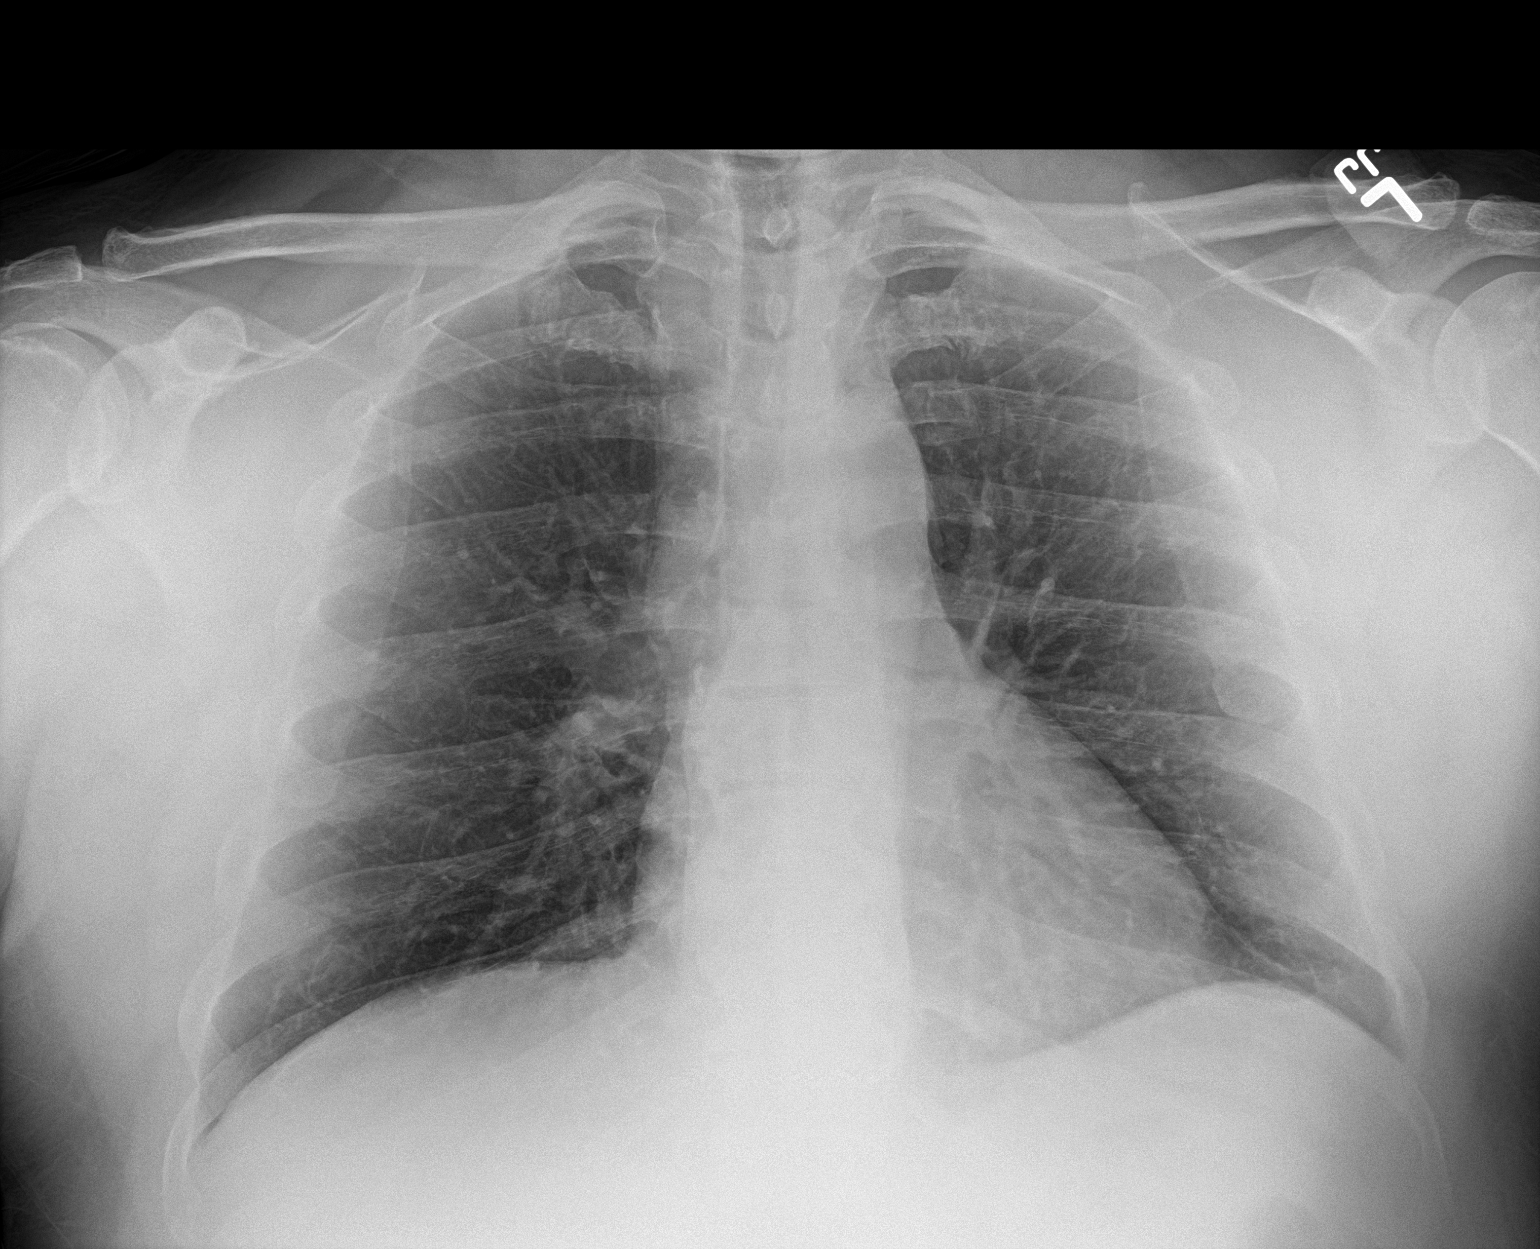

[1 of 1 positions shown; findings below may reference images not displayed]

FINDINGS: Normal heart, mediastinum and hila.

Clear lungs.  No pleural effusion or pneumothorax.

Skeletal structures are grossly intact.
IMPRESSION: No active disease.

## 2021-08-27 IMAGING — CT CT ANGIO CHEST
2 of 6 series · 18 of 46 positions shown · IV contrast (omnipaque)
Comparison: None.

CLINICAL DATA: Chills and elevated D-dimer.

EXAM:
CT ANGIOGRAPHY CHEST WITH CONTRAST
TECHNIQUE: Multidetector CT imaging of the chest was performed using the
standard protocol during bolus administration of intravenous
contrast. Multiplanar CT image reconstructions and MIPs were
obtained to evaluate the vascular anatomy.
CONTRAST:  100mL OMNIPAQUE IOHEXOL 300 MG/ML  SOLN

[Series 6: thins · axial · 0.90mm/px · z∈[-332,-52]mm · 15 of 320 slices shown]
[im 20/320  lung]
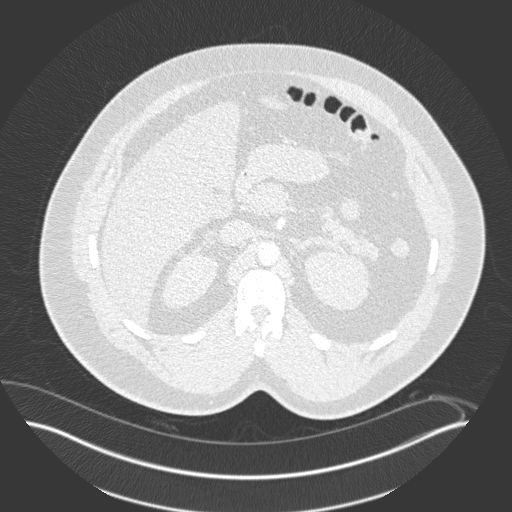
[im 40/320  soft-tissue]
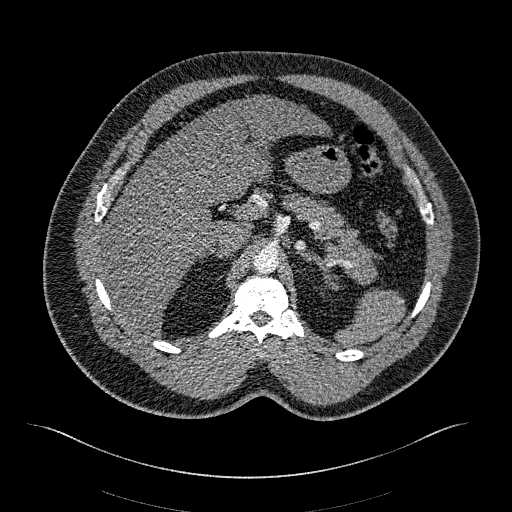
[im 60/320  lung]
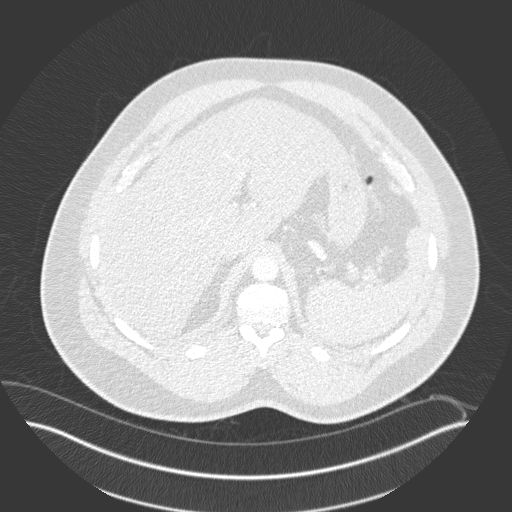
[im 80/320  soft-tissue]
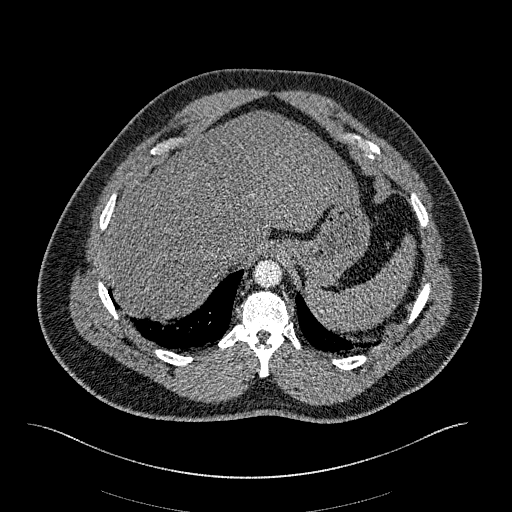
[im 100/320  lung]
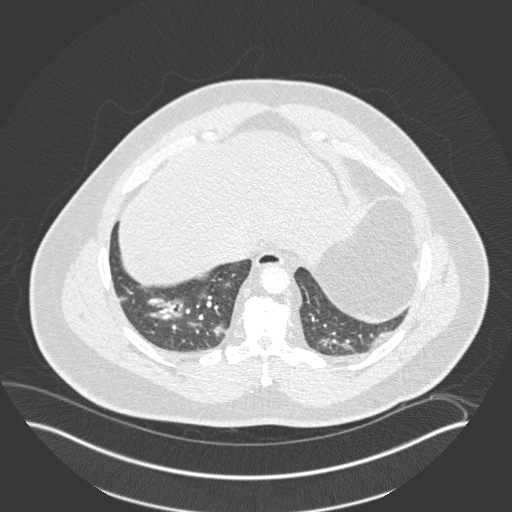
[im 120/320  soft-tissue]
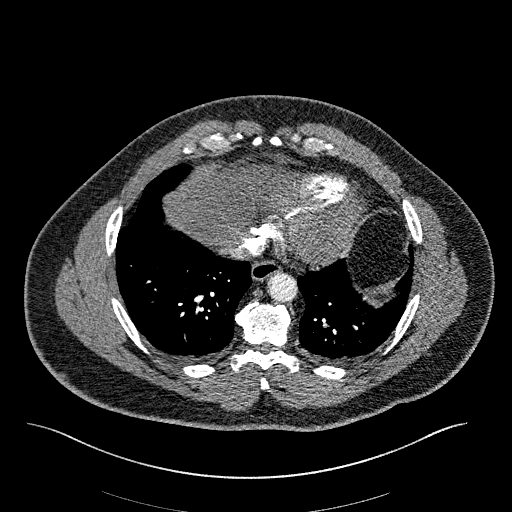
[im 140/320  lung]
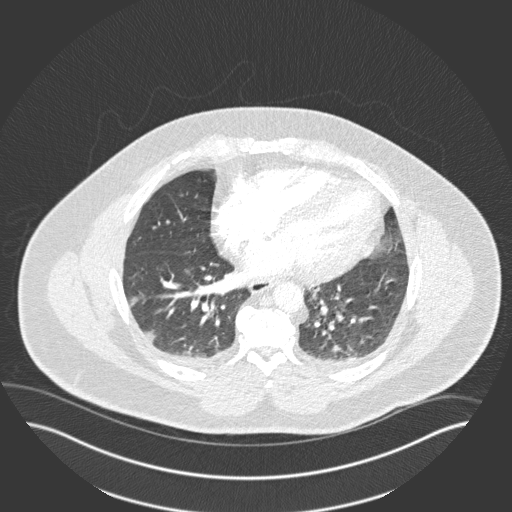
[im 160/320  soft-tissue]
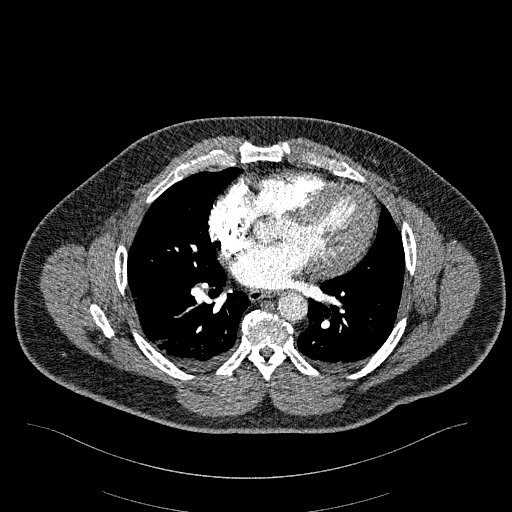
[im 180/320  lung]
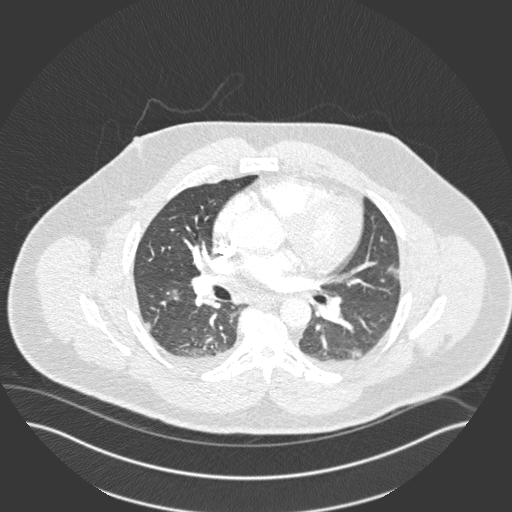
[im 200/320  soft-tissue]
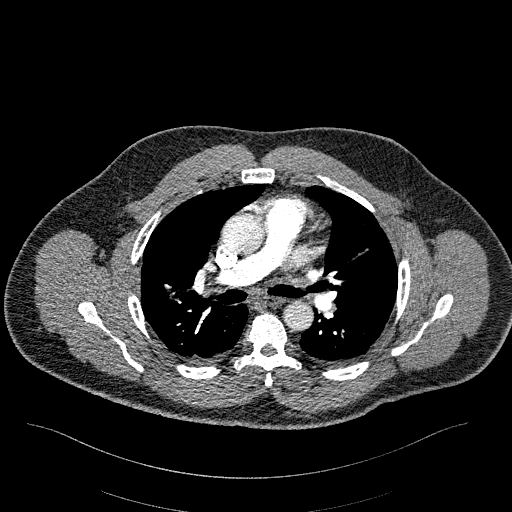
[im 220/320  lung]
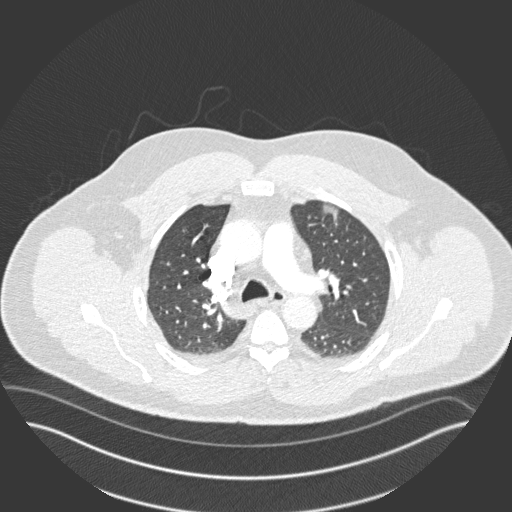
[im 240/320  soft-tissue]
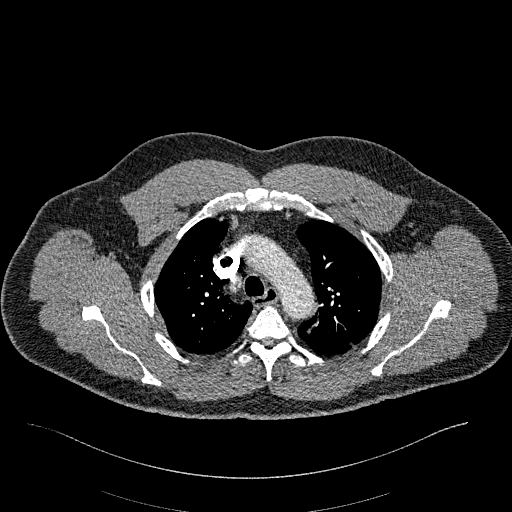
[im 260/320  lung]
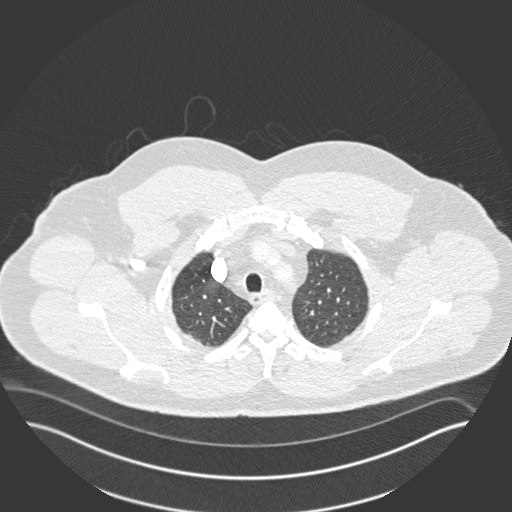
[im 280/320  soft-tissue]
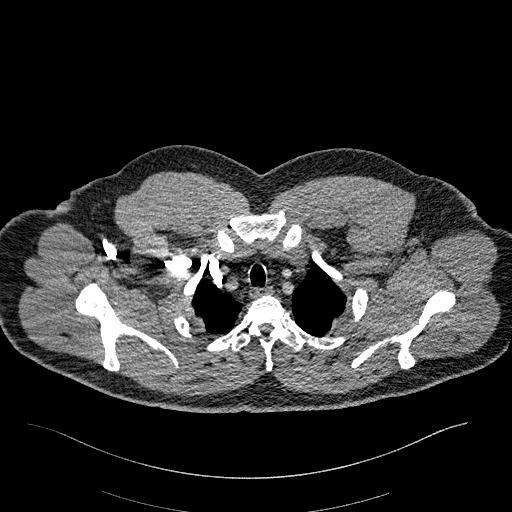
[im 300/320  lung]
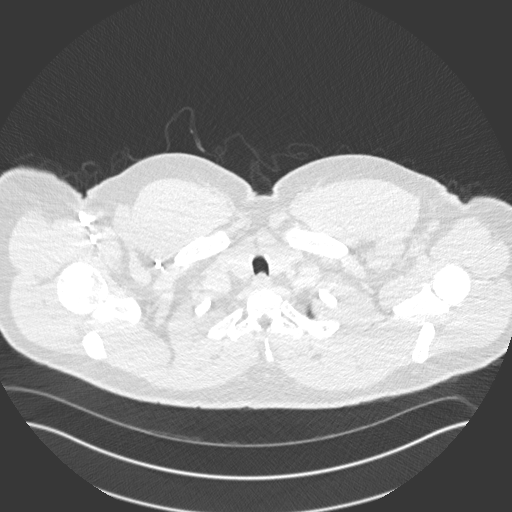

[Series 8: coronal mpr · coronal · 0.63mm/px · 3 of 183 slices shown]
[im 46/183  soft-tissue]
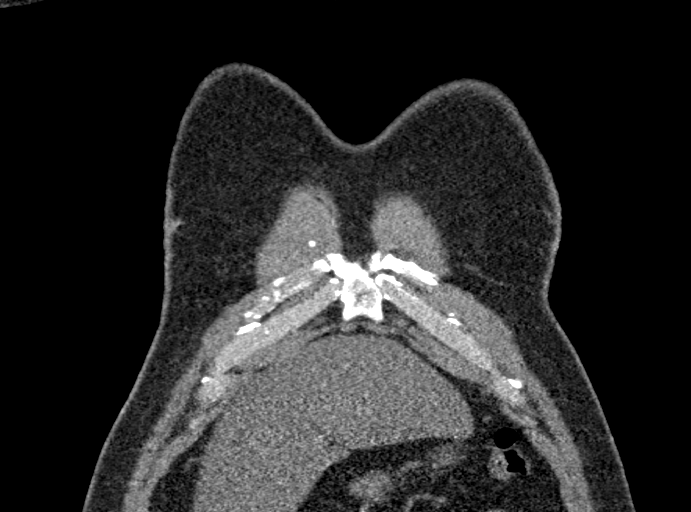
[im 92/183  soft-tissue]
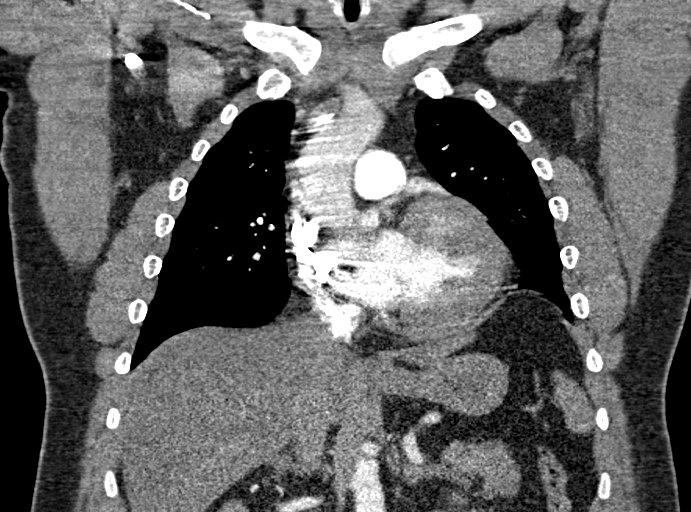
[im 137/183  soft-tissue]
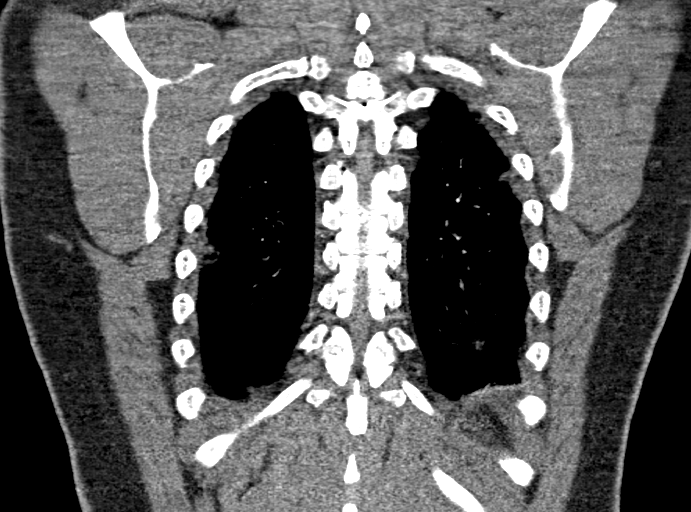

[18 of 46 positions shown; findings below may reference images not displayed]

FINDINGS: Cardiovascular: Satisfactory opacification of the pulmonary arteries
to the segmental level. No evidence of pulmonary embolism. Normal
heart size. No pericardial effusion.

Mediastinum/Nodes: No enlarged mediastinal, hilar, or axillary lymph
nodes. Thyroid gland, trachea, and esophagus demonstrate no
significant findings.

Lungs/Pleura: Numerous small multifocal infiltrates are seen
scattered throughout both lungs.

Very small bilateral pleural effusions are seen.

No pneumothorax is identified.

Upper Abdomen: No acute abnormality.

Musculoskeletal: No chest wall abnormality.

Multilevel degenerative changes are seen within the mid to lower
thoracic spine.

Review of the MIP images confirms the above findings.
IMPRESSION: 1. Numerous small multifocal infiltrates scattered throughout both
lungs.
2. Very small bilateral pleural effusions.

## 2021-08-30 IMAGING — DX DG CHEST 1V PORT
1 series · 1 of 1 positions shown · non-contrast
Comparison: January 27, 2020.

CLINICAL DATA: Shortness of breath, chest pain.

EXAM:
PORTABLE CHEST 1 VIEW

[chest ap]
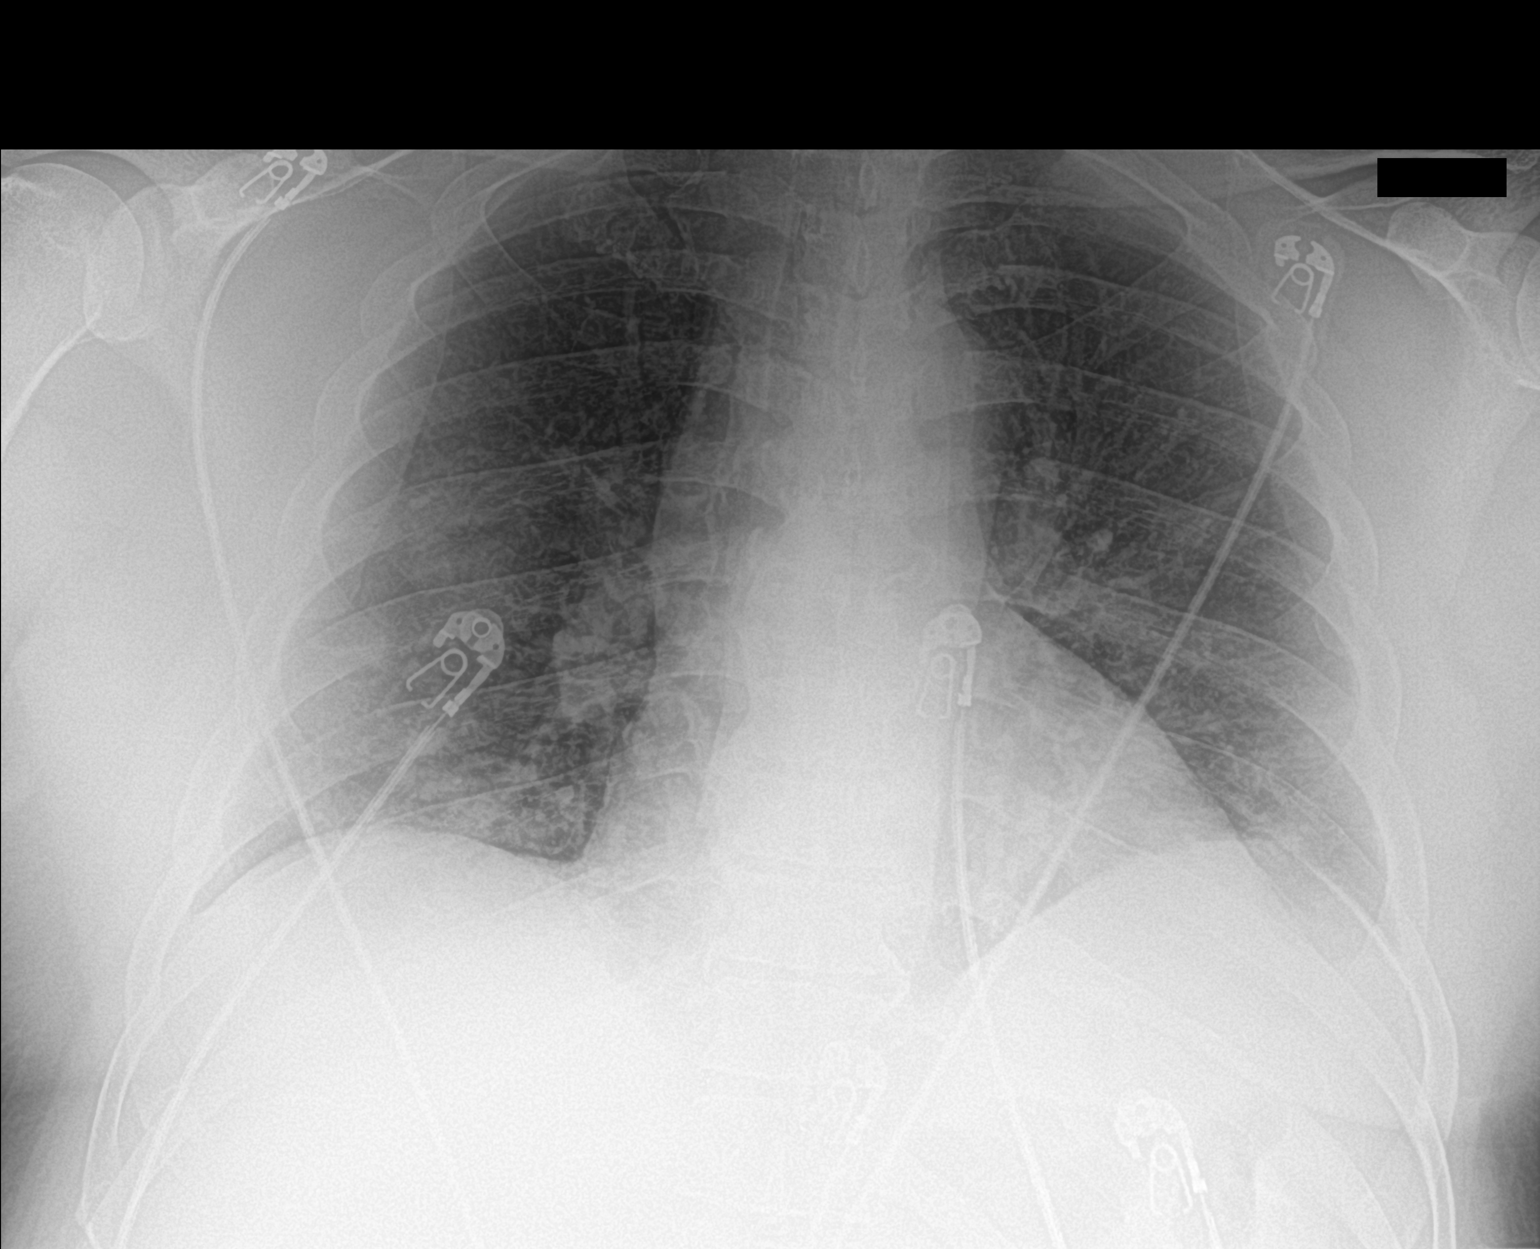

[1 of 1 positions shown; findings below may reference images not displayed]

FINDINGS: The heart size and mediastinal contours are within normal limits. No
pneumothorax or pleural effusion is noted. Mild bibasilar opacities
are noted concerning for subsegmental atelectasis or possibly
infiltrate. The visualized skeletal structures are unremarkable.
IMPRESSION: Mild bibasilar opacities are noted concerning for subsegmental
atelectasis or possibly infiltrate.

## 2021-09-30 ENCOUNTER — Other Ambulatory Visit: Payer: Self-pay | Admitting: Podiatrist

## 2021-10-01 NOTE — Telephone Encounter (Signed)
Please advise 

## 2021-10-19 ENCOUNTER — Other Ambulatory Visit: Payer: Self-pay | Admitting: Podiatrist

## 2021-10-21 NOTE — Telephone Encounter (Signed)
Patient has not been seen since 4 months, f/u appointment may be needed before refill

## 2021-10-23 NOTE — Telephone Encounter (Signed)
Please schedule patient for a f/u appointment for refill medication.

## 2023-04-07 ENCOUNTER — Emergency Department (HOSPITAL_COMMUNITY): Payer: BC Managed Care – PPO

## 2023-04-07 ENCOUNTER — Other Ambulatory Visit: Payer: Self-pay

## 2023-04-07 ENCOUNTER — Emergency Department (HOSPITAL_COMMUNITY)
Admission: EM | Admit: 2023-04-07 | Discharge: 2023-04-07 | Disposition: A | Discharge status: Home / Self Care | Payer: BC Managed Care – PPO | Attending: Emergency Medicine | Admitting: Emergency Medicine

## 2023-04-07 DIAGNOSIS — M62838 Other muscle spasm: Secondary | ICD-10-CM

## 2023-04-07 DIAGNOSIS — M542 Cervicalgia: Secondary | ICD-10-CM | POA: Diagnosis present

## 2023-04-07 MED ORDER — LIDOCAINE 5 % EX PTCH: 1.0000 | MEDICATED_PATCH | CUTANEOUS | 0 refills | Status: AC

## 2023-04-07 MED ORDER — CYCLOBENZAPRINE HCL 10 MG PO TABS: 10.0000 mg | ORAL_TABLET | Freq: Two times a day (BID) | ORAL | 0 refills | Status: AC | PRN

## 2023-04-07 NOTE — ED Triage Notes (Signed)
Pt arrives to ED c/o of neck spasms to left side of neck x 2 days. Pt reports that when moving neck to any direction, pain occurs. Pt reports tenderness to touch. No trauma or injury reported.

## 2023-04-07 NOTE — ED Provider Notes (Signed)
[place] EMERGENCY DEPARTMENT AT [facility] Provider Note   CSN: [id number] Arrival date & time: 04/07/23  2009     History  Chief Complaint  Patient presents with   Neck Pain    Jonathan Casey. is a 52 y.o. male.  Patient is a previously healthy 52 year old man presenting with three days of left sided neck spasms and pain. Patient reports spasms occur randomly. His neck is tender to palpation on the left side and uncomfortable when looking to the left. NSAIDs and topical treatments have not been effective. Denies trauma or instigating injury. Has not experienced any weakness, numbness, or paresthesias.    Neck Pain Associated symptoms: no chest pain and no fever        Home Medications Prior to Admission medications   Medication Sig Start Date End Date Taking? Authorizing Provider  amLODipine (NORVASC) 10 MG tablet Take 10 mg by mouth daily. 05/14/21   [provider]  Ascorbic Acid (VITAMIN C PO) Take 500 mcg by mouth daily.    [provider]  atorvastatin (LIPITOR) 20 MG tablet Take 20 mg by mouth daily. 05/14/21   [provider]  cholecalciferol (VITAMIN D3) 25 MCG (1000 UNIT) tablet Take 1,000 Units by mouth daily.    [provider]  etodolac (LODINE) 500 MG tablet Take 500 mg by mouth 2 (two) times daily. 05/14/21   [provider]  glipiZIDE (GLUCOTROL XL) 5 MG 24 hr tablet Take 5 mg by mouth daily. 05/29/21   [provider]  losartan-hydrochlorothiazide (HYZAAR) 50-12.5 MG tablet Take 1 tablet by mouth daily.    [provider]  metFORMIN (GLUCOPHAGE) 500 MG tablet Take 500 mg by mouth 2 (two) times daily. 05/14/21   [provider]  Misc Natural Products (AIRBORNE ELDERBERRY) CHEW Chew 2 tablets by mouth daily.    [provider]  terbinafine (LAMISIL) 250 MG tablet Take 1 tablet (250 mg total) by mouth daily. Take daily for 90 days (toenail fungus) 06/20/21   Egerton, Orlie Pollen,  DPM  tiZANidine (ZANAFLEX) 4 MG tablet Take 4 mg by mouth every 8 (eight) hours as needed. 05/29/21   [provider]      Allergies    Patient has no known allergies.    Review of Systems   Review of Systems  Constitutional:  Negative for chills, fatigue and fever.  Respiratory:  Negative for shortness of breath.   Cardiovascular:  Negative for chest pain.  Gastrointestinal:  Negative for abdominal pain, nausea and vomiting.  Musculoskeletal:  Positive for neck pain.    Physical Exam Updated Vital Signs BP (!) 141/94 (BP Location: Right Arm)   Pulse (!) 106   Temp 98.8 F (37.1 C)   Resp 19   Ht 5\' 9"  (1.753 m)   Wt 122 kg   SpO2 96%   BMI 39.72 kg/m  Physical Exam Constitutional:      General: He is not in acute distress. Cardiovascular:     Rate and Rhythm: Normal rate and regular rhythm.     Heart sounds: Normal heart sounds.  Pulmonary:     Effort: Pulmonary effort is normal.     Breath sounds: Normal breath sounds.  Abdominal:     General: There is no distension.     Palpations: Abdomen is soft.     Tenderness: There is no abdominal tenderness.  Musculoskeletal:     Cervical back: Normal range of motion. Tenderness present.  Skin:  General: Skin is warm and dry.  Neurological:     General: No focal deficit present.     Mental Status: He is alert and oriented to person, place, and time.     Cranial Nerves: No cranial nerve deficit.     Sensory: No sensory deficit.     Motor: No weakness.     ED Results / Procedures / Treatments   Labs (all labs ordered are listed, but only abnormal results are displayed) Labs Reviewed - No data to display  EKG None  Radiology DG Neck Soft Tissue  Result Date: 04/07/2023 CLINICAL DATA:  Left-sided neck pain for 2 days, initial encounter EXAM: NECK SOFT TISSUES - 1+ VIEW COMPARISON:  None Available. FINDINGS: Seven cervical segments are well visualized. Osteophytic changes are seen. Epiglottis and  aryepiglottic folds are within normal limits. No prevertebral soft tissue abnormality is seen. No radiopaque foreign body is noted. IMPRESSION: No acute soft tissue abnormality noted. Electronically Signed   By: Alcide Clever M.D.   On: 04/07/2023 21:10    Procedures Procedures    Medications Ordered in ED Medications - No data to display  ED Course/ Medical Decision Making/ A&P                             Medical Decision Making Patient arrived to the ED with three days of intermittent neck spasms and left sided positional neck pain. Denies trauma. Negative neurological exam. Discussed with patient possible conservative treatments. Patient discharged with flexeril, lidoderm patches and outpatient follow up as needed.          Final Clinical Impression(s) / ED Diagnoses Final diagnoses:  None    Rx / DC Orders ED Discharge Orders     None         Monna Fam, MD 04/07/23 2309    Tegeler, Canary Brim, MD 04/08/23 [phone]

## 2023-04-07 NOTE — Discharge Instructions (Addendum)
Take flexeril as prescribed and lidoderm patches as needed. Consider outpatient follow up with PCP if symptoms persist.

## 2023-04-13 ENCOUNTER — Ambulatory Visit (INDEPENDENT_AMBULATORY_CARE_PROVIDER_SITE_OTHER): Payer: Self-pay | Admitting: Podiatry

## 2023-04-13 DIAGNOSIS — Z91199 Patient's noncompliance with other medical treatment and regimen due to unspecified reason: Secondary | ICD-10-CM

## 2023-04-13 NOTE — Progress Notes (Signed)
Pt was a no show for apt
